# Patient Record
Sex: Female | Born: 1965 | Race: Black or African American | Hispanic: No | Marital: Married | Smoking: Current some day smoker
Health system: Southern US, Community
[De-identification: ages and names within clinical notes are randomized; demographics above are authoritative.]

## PROBLEM LIST (undated history)

## (undated) DIAGNOSIS — I10 Essential (primary) hypertension: Secondary | ICD-10-CM

## (undated) HISTORY — PX: TUBAL LIGATION: SHX77

## (undated) HISTORY — DX: Essential (primary) hypertension: I10

## (undated) HISTORY — PX: TONSILLECTOMY AND ADENOIDECTOMY: SUR1326

---

## 1998-10-13 ENCOUNTER — Other Ambulatory Visit: Admission: RE | Admit: 1998-10-13 | Discharge: 1998-10-13 | Payer: Self-pay | Admitting: Gynecology

## 2000-04-15 ENCOUNTER — Ambulatory Visit (HOSPITAL_COMMUNITY): Admission: RE | Admit: 2000-04-15 | Discharge: 2000-04-15 | Payer: Self-pay | Admitting: Obstetrics and Gynecology

## 2000-04-15 ENCOUNTER — Encounter: Payer: Self-pay | Admitting: Obstetrics and Gynecology

## 2002-03-15 ENCOUNTER — Other Ambulatory Visit: Admission: RE | Admit: 2002-03-15 | Discharge: 2002-03-15 | Payer: Self-pay | Admitting: Gynecology

## 2003-10-11 ENCOUNTER — Other Ambulatory Visit: Admission: RE | Admit: 2003-10-11 | Discharge: 2003-10-11 | Payer: Self-pay | Admitting: Gynecology

## 2004-04-27 ENCOUNTER — Other Ambulatory Visit: Admission: RE | Admit: 2004-04-27 | Discharge: 2004-04-27 | Payer: Self-pay | Admitting: Gynecology

## 2007-01-26 ENCOUNTER — Other Ambulatory Visit: Admission: RE | Admit: 2007-01-26 | Discharge: 2007-01-26 | Payer: Self-pay | Admitting: Gynecology

## 2009-02-06 ENCOUNTER — Other Ambulatory Visit: Admission: RE | Admit: 2009-02-06 | Discharge: 2009-02-06 | Payer: Self-pay | Admitting: Gynecology

## 2009-02-06 ENCOUNTER — Encounter: Payer: Self-pay | Admitting: Women's Health

## 2009-02-06 ENCOUNTER — Ambulatory Visit: Payer: Self-pay | Admitting: Women's Health

## 2010-02-09 ENCOUNTER — Other Ambulatory Visit: Admission: RE | Admit: 2010-02-09 | Discharge: 2010-02-09 | Payer: Self-pay | Admitting: Gynecology

## 2010-02-09 ENCOUNTER — Ambulatory Visit: Payer: Self-pay | Admitting: Women's Health

## 2012-11-09 ENCOUNTER — Other Ambulatory Visit (HOSPITAL_COMMUNITY)
Admission: RE | Admit: 2012-11-09 | Discharge: 2012-11-09 | Disposition: A | Discharge status: Home / Self Care | Payer: 59 | Source: Ambulatory Visit | Attending: Women's Health | Admitting: Women's Health

## 2012-11-09 ENCOUNTER — Encounter: Payer: Self-pay | Admitting: Women's Health

## 2012-11-09 ENCOUNTER — Ambulatory Visit (INDEPENDENT_AMBULATORY_CARE_PROVIDER_SITE_OTHER): Payer: 59 | Admitting: Women's Health

## 2012-11-09 VITALS — BP 118/70 | Ht 65.0 in | Wt 163.0 lb

## 2012-11-09 DIAGNOSIS — B9689 Other specified bacterial agents as the cause of diseases classified elsewhere: Secondary | ICD-10-CM

## 2012-11-09 DIAGNOSIS — L509 Urticaria, unspecified: Secondary | ICD-10-CM

## 2012-11-09 DIAGNOSIS — Z113 Encounter for screening for infections with a predominantly sexual mode of transmission: Secondary | ICD-10-CM | POA: Insufficient documentation

## 2012-11-09 DIAGNOSIS — Z01419 Encounter for gynecological examination (general) (routine) without abnormal findings: Secondary | ICD-10-CM | POA: Insufficient documentation

## 2012-11-09 DIAGNOSIS — N760 Acute vaginitis: Secondary | ICD-10-CM

## 2012-11-09 DIAGNOSIS — A499 Bacterial infection, unspecified: Secondary | ICD-10-CM

## 2012-11-09 LAB — WET PREP FOR TRICH, YEAST, CLUE: Yeast Wet Prep HPF POC: NONE SEEN

## 2012-11-09 MED ORDER — PREDNISONE (PAK) 5 MG PO TABS: ORAL_TABLET | ORAL | Status: DC

## 2012-11-09 MED ORDER — METRONIDAZOLE 500 MG PO TABS: ORAL_TABLET | ORAL | Status: DC

## 2012-11-09 MED ORDER — METRONIDAZOLE 0.75 % VA GEL: VAGINAL | Status: DC

## 2012-11-09 NOTE — Progress Notes (Addendum)
Meredith Cox Dec 03, 1965 [id number]    History:    The patient presents for annual exam.  Regular monthly cycle/BTL/same partner many years. Complaint of vaginal odor especially after menstrual cycle. Normal mammogram yearly with mobile unit at work. Normal Pap history. 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 Has used over-the-counter hydrocortisone lotion without relief.  Past medical history, past surgical history, family history and social history were all reviewed and documented in the EPIC chart. Works at [employer]. 2 daughters 53 in school,28  has 3 children. Maternal aunt breast cancer at 26. Mother hypothyroid.   ROS:  A  ROS was performed and pertinent positives and negatives are included in the history.  Exam:  Filed Vitals:   11/09/12 1448  BP: 118/70    General appearance:  Normal Head/Neck:  Normal, without cervical or supraclavicular adenopathy. Thyroid:  Symmetrical, normal in size, without palpable masses or nodularity. Respiratory  Effort:  Normal  Auscultation:  Clear without wheezing or rhonchi Cardiovascular  Auscultation:  Regular rate, without rubs, murmurs or gallops  Edema/varicosities:  Not grossly evident Abdominal  Soft,nontender, without masses, guarding or rebound.  Liver/spleen:  No organomegaly noted  Hernia:  None appreciated  Skin  Inspection:  Widespread slightly raised hives on upper body  Palpation:  Grossly normal Neurologic/psychiatric  Orientation:  Normal with appropriate conversation.  Mood/affect:  Normal  Genitourinary    Breasts: Examined lying and sitting.     Right: Without masses, retractions, discharge or axillary adenopathy.     Left: Without masses, retractions, discharge or axillary adenopathy.   Inguinal/mons:  Normal without inguinal adenopathy  External genitalia:  Normal  BUS/Urethra/Skene's glands:  Normal  Bladder:   Normal  Vagina:  Wet prep positive for amines, clues, Trichomonas, TNTC bacteria  Cervix:  Normal  Uterus:  retroverted, normal in size, shape and contour.  Midline and mobile  Adnexa/parametria:     Rt: Without masses or tenderness.   Lt: Without masses or tenderness.  Anus and perineum: Normal  Digital rectal exam: Normal sphincter tone without palpated masses or tenderness  Assessment/Plan:  47 y.o. SBF G2P2 for annual exam.    Trichomonas STD screen New onset hives Smoker less than half pack per day  Plan: prednisone 5 mg dose pack given with instructions. Reviewed if no relief to schedule appointment with dermatologist. Encouraged to wear long sleeve shirt under uniform. SBE's, continue annual mammogram, increase regular exercise and decrease calories for weight loss. Instructed to fax health screening labs to our office for review. Flagyl 2 g by mouth for both. Reviewed importance of abstinence, discussing probable infidelity with partner. Pap with GC/Chlamydia, HIV, hep B, C., RPR. Has joined quit smoking for life at work.    Harrington Challenger WHNP, 3:24 PM 11/09/2012

## 2012-11-09 NOTE — Patient Instructions (Signed)

## 2012-11-09 NOTE — Addendum Note (Signed)
Addended by: Richardson Chiquito on: 11/09/2012 04:29 PM   Modules accepted: Orders

## 2012-11-10 LAB — HEPATITIS C ANTIBODY: HCV Ab: NEGATIVE

## 2012-11-10 LAB — HEPATITIS B SURFACE ANTIGEN: Hepatitis B Surface Ag: NEGATIVE

## 2012-11-10 LAB — HIV ANTIBODY (ROUTINE TESTING W REFLEX): HIV: NONREACTIVE

## 2012-11-10 LAB — RPR

## 2012-11-14 ENCOUNTER — Ambulatory Visit: Payer: Self-pay | Admitting: Gynecology

## 2012-11-16 ENCOUNTER — Encounter: Payer: Self-pay | Admitting: Women's Health

## 2013-02-01 ENCOUNTER — Encounter: Payer: Self-pay | Admitting: Women's Health

## 2013-11-13 ENCOUNTER — Encounter: Payer: Self-pay | Admitting: Women's Health

## 2013-11-13 ENCOUNTER — Ambulatory Visit (INDEPENDENT_AMBULATORY_CARE_PROVIDER_SITE_OTHER): Payer: 59 | Admitting: Women's Health

## 2013-11-13 VITALS — BP 130/80 | Ht 65.0 in | Wt 171.0 lb

## 2013-11-13 DIAGNOSIS — Z01419 Encounter for gynecological examination (general) (routine) without abnormal findings: Secondary | ICD-10-CM

## 2013-11-13 DIAGNOSIS — F17200 Nicotine dependence, unspecified, uncomplicated: Secondary | ICD-10-CM | POA: Insufficient documentation

## 2013-11-13 NOTE — Patient Instructions (Signed)

## 2013-11-13 NOTE — Progress Notes (Signed)
Meredith HashimotoBonita M McCullough [birth year] [id number]    History:    Presents for annual exam.  Regular monthly cycle/BTL/same partner. Abnormal Pap in her 20s treated with cryo-, normal Paps after. Normal mammogram history. Smoker. Labs and mammograms at work  Past medical history, past surgical history, family history and social history were all reviewed and documented in the EPIC chart. Works for [employer]. 2 daughters 5021 [employer] in [employer], 48 year old daughter has 3 children. Mother hyperthyroid. Maternal and breast cancer.  ROS:  A  12 point ROS was performed and pertinent positives and negatives are included.  Exam:  Filed Vitals:   11/13/13 1424  BP: 130/80    General appearance:  Normal Thyroid:  Symmetrical, normal in size, without palpable masses or nodularity. Respiratory  Auscultation:  Clear without wheezing or rhonchi Cardiovascular  Auscultation:  Regular rate, without rubs, murmurs or gallops  Edema/varicosities:  Not grossly evident Abdominal  Soft,nontender, without masses, guarding or rebound.  Liver/spleen:  No organomegaly noted  Hernia:  None appreciated  Skin  Inspection:  Grossly normal   Breasts: Examined lying and sitting.     Right: Without masses, retractions, discharge or axillary adenopathy.     Left: Without masses, retractions, discharge or axillary adenopathy. Gentitourinary   Inguinal/mons:  Normal without inguinal adenopathy  External genitalia:  Normal  BUS/Urethra/Skene's glands:  Normal  Vagina:  Normal  Cervix:  Normal  Uterus:   normal in size, shape and contour.  Midline and mobile  Adnexa/parametria:     Rt: Without masses or tenderness.   Lt: Without masses or tenderness.  Anus and perineum: Normal  Digital rectal exam: Normal sphincter tone without palpated masses or tenderness  Assessment/Plan:  48 y.o.SBF G2P2  for annual exam with no complaints.  Normal GYN exam/BTL Smoker  Plan: SBE's, annual mammogram, 3D tomography  reviewed and encouraged history of dense breast, exercise, calcium rich diet, vitamin D 1000 daily encouraged. Aware of need to quit smoking, in smoking cessation program at work. Will fax lab results. Pap normal 2014, new screening guidelines reviewed.   Note: This dictation was prepared with Dragon/digital dictation.  Any transcriptional errors that result are unintentional. Harrington ChallengerYOUNG,Vance Hochmuth J [facility] HospitalWHNP, 3:29 PM 11/13/2013

## 2014-02-07 ENCOUNTER — Encounter: Payer: Self-pay | Admitting: Women's Health

## 2014-02-11 ENCOUNTER — Encounter: Payer: Self-pay | Admitting: Women's Health

## 2014-11-15 ENCOUNTER — Other Ambulatory Visit (HOSPITAL_COMMUNITY)
Admission: RE | Admit: 2014-11-15 | Discharge: 2014-11-15 | Disposition: A | Discharge status: Home / Self Care | Payer: 59 | Source: Ambulatory Visit | Attending: Women's Health | Admitting: Women's Health

## 2014-11-15 ENCOUNTER — Ambulatory Visit (INDEPENDENT_AMBULATORY_CARE_PROVIDER_SITE_OTHER): Payer: 59 | Admitting: Women's Health

## 2014-11-15 ENCOUNTER — Encounter: Payer: Self-pay | Admitting: Women's Health

## 2014-11-15 VITALS — BP 132/90 | Ht 65.0 in | Wt 181.0 lb

## 2014-11-15 DIAGNOSIS — Z720 Tobacco use: Secondary | ICD-10-CM | POA: Diagnosis not present

## 2014-11-15 DIAGNOSIS — B373 Candidiasis of vulva and vagina: Secondary | ICD-10-CM | POA: Diagnosis not present

## 2014-11-15 DIAGNOSIS — Z01419 Encounter for gynecological examination (general) (routine) without abnormal findings: Secondary | ICD-10-CM

## 2014-11-15 DIAGNOSIS — Z113 Encounter for screening for infections with a predominantly sexual mode of transmission: Secondary | ICD-10-CM

## 2014-11-15 DIAGNOSIS — B3731 Acute candidiasis of vulva and vagina: Secondary | ICD-10-CM

## 2014-11-15 DIAGNOSIS — Z1151 Encounter for screening for human papillomavirus (HPV): Secondary | ICD-10-CM | POA: Diagnosis present

## 2014-11-15 DIAGNOSIS — F17200 Nicotine dependence, unspecified, uncomplicated: Secondary | ICD-10-CM

## 2014-11-15 LAB — WET PREP FOR TRICH, YEAST, CLUE
CLUE CELLS WET PREP: NONE SEEN
TRICH WET PREP: NONE SEEN

## 2014-11-15 MED ORDER — VARENICLINE TARTRATE 1 MG PO TABS: 1.0000 mg | ORAL_TABLET | Freq: Two times a day (BID) | ORAL | Status: DC

## 2014-11-15 MED ORDER — FLUCONAZOLE 150 MG PO TABS: 150.0000 mg | ORAL_TABLET | Freq: Once | ORAL | Status: DC

## 2014-11-15 MED ORDER — VARENICLINE TARTRATE 0.5 MG PO TABS: 0.5000 mg | ORAL_TABLET | Freq: Two times a day (BID) | ORAL | Status: DC

## 2014-11-15 NOTE — Patient Instructions (Signed)
Health Maintenance Adopting a healthy lifestyle and getting preventive care can go a long way to promote health and wellness. Talk with your health care provider about what schedule of regular examinations is right for you. This is a good chance for you to check in with your provider about disease prevention and staying healthy. In between checkups, there are plenty of things you can do on your own. Experts have done a lot of research about which lifestyle changes and preventive measures are most likely to keep you healthy. Ask your health care provider for more information. WEIGHT AND DIET  Eat a healthy diet  Be sure to include plenty of vegetables, fruits, low-fat dairy products, and lean protein.  Do not eat a lot of foods high in solid fats, added sugars, or salt.  Get regular exercise. This is one of the most important things you can do for your health.  Most adults should exercise for at least 150 minutes each week. The exercise should increase your heart rate and make you sweat (moderate-intensity exercise).  Most adults should also do strengthening exercises at least twice a week. This is in addition to the moderate-intensity exercise.  Maintain a healthy weight  Body mass index (BMI) is a measurement that can be used to identify possible weight problems. It estimates body fat based on height and weight. Your health care provider can help determine your BMI and help you achieve or maintain a healthy weight.  For females 25 years of age and older:   A BMI below 18.5 is considered underweight.  A BMI of 18.5 to 24.9 is normal.  A BMI of 25 to 29.9 is considered overweight.  A BMI of 30 and above is considered obese.  Watch levels of cholesterol and blood lipids  You should start having your blood tested for lipids and cholesterol at 49 years of age, then have this test every 5 years.  You may need to have your cholesterol levels checked more often if:  Your lipid or  cholesterol levels are high.  You are older than 49 years of age.  You are at high risk for heart disease.  CANCER SCREENING   Lung Cancer  Lung cancer screening is recommended for adults 97-92 years old who are at high risk for lung cancer because of a history of smoking.  A yearly low-dose CT scan of the lungs is recommended for people who:  Currently smoke.  Have quit within the past 15 years.  Have at least a 30-pack-year history of smoking. A pack year is smoking an average of one pack of cigarettes a day for 1 year.  Yearly screening should continue until it has been 15 years since you quit.  Yearly screening should stop if you develop a health problem that would prevent you from having lung cancer treatment.  Breast Cancer  Practice breast self-awareness. This means understanding how your breasts normally appear and feel.  It also means doing regular breast self-exams. Let your health care provider know about any changes, no matter how small.  If you are in your 20s or 30s, you should have a clinical breast exam (CBE) by a health care provider every 1-3 years as part of a regular health exam.  If you are 76 or older, have a CBE every year. Also consider having a breast X-ray (mammogram) every year.  If you have a family history of breast cancer, talk to your health care provider about genetic screening.  If you are  at high risk for breast cancer, talk to your health care provider about having an MRI and a mammogram every year.  Breast cancer gene (BRCA) assessment is recommended for women who have family members with BRCA-related cancers. BRCA-related cancers include:  Breast.  Ovarian.  Tubal.  Peritoneal cancers.  Results of the assessment will determine the need for genetic counseling and BRCA1 and BRCA2 testing. Cervical Cancer Routine pelvic examinations to screen for cervical cancer are no longer recommended for nonpregnant women who are considered low  risk for cancer of the pelvic organs (ovaries, uterus, and vagina) and who do not have symptoms. A pelvic examination may be necessary if you have symptoms including those associated with pelvic infections. Ask your health care provider if a screening pelvic exam is right for you.   The Pap test is the screening test for cervical cancer for women who are considered at risk.  If you had a hysterectomy for a problem that was not cancer or a condition that could lead to cancer, then you no longer need Pap tests.  If you are older than 65 years, and you have had normal Pap tests for the past 10 years, you no longer need to have Pap tests.  If you have had past treatment for cervical cancer or a condition that could lead to cancer, you need Pap tests and screening for cancer for at least 20 years after your treatment.  If you no longer get a Pap test, assess your risk factors if they change (such as having a new sexual partner). This can affect whether you should start being screened again.  Some women have medical problems that increase their chance of getting cervical cancer. If this is the case for you, your health care provider may recommend more frequent screening and Pap tests.  The human papillomavirus (HPV) test is another test that may be used for cervical cancer screening. The HPV test looks for the virus that can cause cell changes in the cervix. The cells collected during the Pap test can be tested for HPV.  The HPV test can be used to screen women 30 years of age and older. Getting tested for HPV can extend the interval between normal Pap tests from three to five years.  An HPV test also should be used to screen women of any age who have unclear Pap test results.  After 49 years of age, women should have HPV testing as often as Pap tests.  Colorectal Cancer  This type of cancer can be detected and often prevented.  Routine colorectal cancer screening usually begins at 50 years of  age and continues through 49 years of age.  Your health care provider may recommend screening at an earlier age if you have risk factors for colon cancer.  Your health care provider may also recommend using home test kits to check for hidden blood in the stool.  A small camera at the end of a tube can be used to examine your colon directly (sigmoidoscopy or colonoscopy). This is done to check for the earliest forms of colorectal cancer.  Routine screening usually begins at age 50.  Direct examination of the colon should be repeated every 5-10 years through 49 years of age. However, you may need to be screened more often if early forms of precancerous polyps or small growths are found. Skin Cancer  Check your skin from head to toe regularly.  Tell your health care provider about any new moles or changes in   moles, especially if there is a change in a mole's shape or color.  Also tell your health care provider if you have a mole that is larger than the size of a pencil eraser.  Always use sunscreen. Apply sunscreen liberally and repeatedly throughout the day.  Protect yourself by wearing long sleeves, pants, a wide-brimmed hat, and sunglasses whenever you are outside. HEART DISEASE, DIABETES, AND HIGH BLOOD PRESSURE   Have your blood pressure checked at least every 1-2 years. High blood pressure causes heart disease and increases the risk of stroke.  If you are between 75 years and 42 years old, ask your health care provider if you should take aspirin to prevent strokes.  Have regular diabetes screenings. This involves taking a blood sample to check your fasting blood sugar level.  If you are at a normal weight and have a low risk for diabetes, have this test once every three years after 49 years of age.  If you are overweight and have a high risk for diabetes, consider being tested at a younger age or more often. PREVENTING INFECTION  Hepatitis B  If you have a higher risk for  hepatitis B, you should be screened for this virus. You are considered at high risk for hepatitis B if:  You were born in a country where hepatitis B is common. Ask your health care provider which countries are considered high risk.  Your parents were born in a high-risk country, and you have not been immunized against hepatitis B (hepatitis B vaccine).  You have HIV or AIDS.  You use needles to inject street drugs.  You live with someone who has hepatitis B.  You have had sex with someone who has hepatitis B.  You get hemodialysis treatment.  You take certain medicines for conditions, including cancer, organ transplantation, and autoimmune conditions. Hepatitis C  Blood testing is recommended for:  Everyone born from [birth year] through 1965.  Anyone with known risk factors for hepatitis C. Sexually transmitted infections (STIs)  You should be screened for sexually transmitted infections (STIs) including gonorrhea and chlamydia if:  You are sexually active and are younger than 48 years of age.  You are older than 49 years of age and your health care provider tells you that you are at risk for this type of infection.  Your sexual activity has changed since you were last screened and you are at an increased risk for chlamydia or gonorrhea. Ask your health care provider if you are at risk.  If you do not have HIV, but are at risk, it may be recommended that you take a prescription medicine daily to prevent HIV infection. This is called pre-exposure prophylaxis (PrEP). You are considered at risk if:  You are sexually active and do not regularly use condoms or know the HIV status of your partner(s).  You take drugs by injection.  You are sexually active with a partner who has HIV. Talk with your health care provider about whether you are at high risk of being infected with HIV. If you choose to begin PrEP, you should first be tested for HIV. You should then be tested every 3 months for  as long as you are taking PrEP.  PREGNANCY   If you are premenopausal and you may become pregnant, ask your health care provider about preconception counseling.  If you may become pregnant, take 400 to 800 micrograms (mcg) of folic acid every day.  If you want to prevent pregnancy, talk to your  health care provider about birth control (contraception). OSTEOPOROSIS AND MENOPAUSE   Osteoporosis is a disease in which the bones lose minerals and strength with aging. This can result in serious bone fractures. Your risk for osteoporosis can be identified using a bone density scan.  If you are 65 years of age or older, or if you are at risk for osteoporosis and fractures, ask your health care provider if you should be screened.  Ask your health care provider whether you should take a calcium or vitamin D supplement to lower your risk for osteoporosis.  Menopause may have certain physical symptoms and risks.  Hormone replacement therapy may reduce some of these symptoms and risks. Talk to your health care provider about whether hormone replacement therapy is right for you.  HOME CARE INSTRUCTIONS   Schedule regular health, dental, and eye exams.  Stay current with your immunizations.   Do not use any tobacco products including cigarettes, chewing tobacco, or electronic cigarettes.  If you are pregnant, do not drink alcohol.  If you are breastfeeding, limit how much and how often you drink alcohol.  Limit alcohol intake to no more than 1 drink per day for nonpregnant women. One drink equals 12 ounces of beer, 5 ounces of wine, or 1 ounces of hard liquor.  Do not use street drugs.  Do not share needles.  Ask your health care provider for help if you need support or information about quitting drugs.  Tell your health care provider if you often feel depressed.  Tell your health care provider if you have ever been abused or do not feel safe at home. Document Released: 10/12/2010  Document Revised: 08/13/2013 Document Reviewed: 02/28/2013 ExitCare Patient Information 2015 [employer]. This information is not intended to replace advice given to you by your health care provider. Make sure you discuss any questions you have with your health care provider. Exercise to Stay Healthy Exercise helps you become and stay healthy. EXERCISE IDEAS AND TIPS Choose exercises that:  You enjoy.  Fit into your day. You do not need to exercise really hard to be healthy. You can do exercises at a slow or medium level and stay healthy. You can:  Stretch before and after working out.  Try yoga, Pilates, or tai chi.  Lift weights.  Walk fast, swim, jog, run, climb stairs, bicycle, dance, or rollerskate.  Take aerobic classes. Exercises that burn about 150 calories:  Running 1  miles in 15 minutes.  Playing volleyball for 45 to 60 minutes.  Washing and waxing a car for 45 to 60 minutes.  Playing touch football for 45 minutes.  Walking 1  miles in 35 minutes.  Pushing a stroller 1  miles in 30 minutes.  Playing basketball for 30 minutes.  Raking leaves for 30 minutes.  Bicycling 5 miles in 30 minutes.  Walking 2 miles in 30 minutes.  Dancing for 30 minutes.  Shoveling snow for 15 minutes.  Swimming laps for 20 minutes.  Walking up stairs for 15 minutes.  Bicycling 4 miles in 15 minutes.  Gardening for 30 to 45 minutes.  Jumping rope for 15 minutes.  Washing windows or floors for 45 to 60 minutes. Document Released: 05/01/2010 Document Revised: 06/21/2011 Document Reviewed: 05/01/2010 ExitCare Patient Information 2015 [employer]. This information is not intended to replace advice given to you by your health care provider. Make sure you discuss any questions you have with your health care provider.  

## 2014-11-15 NOTE — Progress Notes (Signed)
Meredith Cox 24-Apr-1965 [id number]    History:    Presents for annual exam.  Cycles becoming irregular, bled most of the month of June, no bleeding in July or yet this month. Monthly cycle prior to June. BTL. 2005 LGSIL cryo-with normal Paps after. Normal mammogram history. Normal screening labs at work. Same partner.  Past medical history, past surgical history, family history and social history were all reviewed and documented in the EPIC chart. Works at [employer]. 2 daughters both doing well.  ROS:  A ROS was performed and pertinent positives and negatives are included.  Exam:  Filed Vitals:   11/15/14 0816  BP: 132/90    General appearance:  Normal Thyroid:  Symmetrical, normal in size, without palpable masses or nodularity. Respiratory  Auscultation:  Clear without wheezing or rhonchi Cardiovascular  Auscultation:  Regular rate, without rubs, murmurs or gallops  Edema/varicosities:  Not grossly evident Abdominal  Soft,nontender, without masses, guarding or rebound.  Liver/spleen:  No organomegaly noted  Hernia:  None appreciated  Skin  Inspection:  Grossly normal   Breasts: Examined lying and sitting.     Right: Without masses, retractions, discharge or axillary adenopathy.     Left: Without masses, retractions, discharge or axillary adenopathy. Gentitourinary   Inguinal/mons:  Normal without inguinal adenopathy  External genitalia:  Normal  BUS/Urethra/Skene's glands:  Normal  Vagina:  Normal wet prep positive for yeast  Cervix:  Normal  Uterus:   normal in size, shape and contour.  Midline and mobile  Adnexa/parametria:     Rt: Without masses or tenderness.   Lt: Without masses or tenderness.  Anus and perineum: Normal  Digital rectal exam: Normal sphincter tone without palpated masses or tenderness  Assessment/Plan:  49 y.o. SBF G2 P2 for annual exam.   Irregular cycle -bled most of June, none since Yeast vaginitis Menopausal  symptoms BTL 2005 CIN-1 cryo-normal Paps after Smoker  Plan: Diflucan 150 by mouth times one dose with refill. Keep menstrual record if continued issues with irregular bleeding, cycle lasting greater than 7 days or more frequent than every 21 instructed to call will proceed with sonohysterogram. Menopausal symptoms reviewed. SBE's, continue annual mammogram, 3-D tomography reviewed and encouraged history of dense breasts. GC/Chlamydia,, Pap with HR HPV typing, new screening guidelines reviewed. Smoking cessation reviewed, currently in the smoking cessation program at work helping some, Chantix reviewed, reviewed risks of bad dreams, would like to try prescription, proper use given and reviewed start up instructions. Aware of need to quit smoking.      Harrington Challenger [facility], 5:15 PM 11/15/2014

## 2014-11-16 LAB — GC/CHLAMYDIA PROBE AMP
CT Probe RNA: NEGATIVE
GC Probe RNA: NEGATIVE

## 2014-11-18 ENCOUNTER — Telehealth: Payer: Self-pay | Admitting: *Deleted

## 2014-11-18 LAB — CYTOLOGY - PAP

## 2014-11-18 NOTE — Telephone Encounter (Signed)
PA done and Rx for Chantix has been approved by insurance effective 11/16/14-11/16/15

## 2015-01-01 ENCOUNTER — Telehealth: Payer: Self-pay | Admitting: *Deleted

## 2015-01-01 NOTE — Telephone Encounter (Signed)
Prior Authorization for Chantix 1 mg done online via cover my meds, will wait for response.

## 2015-02-05 NOTE — Telephone Encounter (Signed)
[facility], will wait for response.

## 2015-02-06 NOTE — Telephone Encounter (Signed)
Pharmacy aware and pt as well

## 2015-02-06 NOTE — Telephone Encounter (Signed)
Medication approved effective 02/06/15-02/06/16 per [facility]

## 2015-02-14 ENCOUNTER — Encounter: Payer: Self-pay | Admitting: Women's Health

## 2015-11-10 ENCOUNTER — Telehealth: Payer: Self-pay | Admitting: *Deleted

## 2015-11-10 NOTE — Telephone Encounter (Signed)
Pt called c/o new finding breast lump, pt wanted to have order for 3-d mammogram. I explained to pt that she will need to see provider if breast problem. Pt said she will call to schedule OV

## 2015-11-18 ENCOUNTER — Encounter: Payer: 59 | Admitting: Women's Health

## 2015-12-04 ENCOUNTER — Ambulatory Visit (INDEPENDENT_AMBULATORY_CARE_PROVIDER_SITE_OTHER): Payer: 59 | Admitting: Women's Health

## 2015-12-04 ENCOUNTER — Encounter: Payer: Self-pay | Admitting: Women's Health

## 2015-12-04 VITALS — BP 122/74 | Ht 65.0 in | Wt 178.0 lb

## 2015-12-04 DIAGNOSIS — Z720 Tobacco use: Secondary | ICD-10-CM | POA: Diagnosis not present

## 2015-12-04 DIAGNOSIS — F17200 Nicotine dependence, unspecified, uncomplicated: Secondary | ICD-10-CM

## 2015-12-04 DIAGNOSIS — Z01419 Encounter for gynecological examination (general) (routine) without abnormal findings: Secondary | ICD-10-CM | POA: Diagnosis not present

## 2015-12-04 MED ORDER — VARENICLINE TARTRATE 0.5 MG PO TABS: 0.5000 mg | ORAL_TABLET | Freq: Two times a day (BID) | ORAL | 1 refills | Status: DC

## 2015-12-04 NOTE — Progress Notes (Signed)
Helen HashimotoBonita M McCullough 07-Jul-1965 [id number]    History:    Presents for annual exam.  Monthly cycle/BTL/same partner with negative STD screen. 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 Normal mammogram history. Reports normal labs at work is having them done next month. Has not had a screening colonoscopy. Half a pack smoking daily. Regular exercise, walking greater than 10,000 steps daily.  Past medical history, past surgical history, family history and social history were all reviewed and documented in the EPIC chart. Works for [employer]. 2 daughters both doing well.  ROS:  A ROS was performed and pertinent positives and negatives are included.  Exam:  Vitals:   12/04/15 0907  BP: 122/74  Weight: 178 lb (80.7 kg)  Height: 5\' 5"  (1.651 m)   Body mass index is 29.62 kg/m.   General appearance:  Normal Thyroid:  Symmetrical, normal in size, without palpable masses or nodularity. Respiratory  Auscultation:  Clear without wheezing or rhonchi Cardiovascular  Auscultation:  Regular rate, without rubs, murmurs or gallops  Edema/varicosities:  Not grossly evident Abdominal  Soft,nontender, without masses, guarding or rebound.  Liver/spleen:  No organomegaly noted  Hernia:  None appreciated  Skin  Inspection:  Grossly normal   Breasts: Examined lying and sitting.     Right: Without masses, retractions, discharge or axillary adenopathy.     Left: Without masses, retractions, discharge or axillary adenopathy. Gentitourinary   Inguinal/mons:  Normal without inguinal adenopathy  External genitalia:  Normal  BUS/Urethra/Skene's glands:  Normal  Vagina:  Normal  Cervix:  Normal  Uterus:  normal in size, shape and contour.  Midline and mobile  Adnexa/parametria:     Rt: Without masses or tenderness.   Lt: Without masses or tenderness.  Anus and perineum: Normal  Digital rectal exam: Normal sphincter tone without palpated masses or tenderness  Assessment/Plan:  50 y.o. SBF  G2P2 for annual exam.     Monthly cycle/BTL/same partner 2005 LGSIL cryo-normal Paps after Smoker less than half pack daily  Plan: Aware of need to quit smoking will start back on Chantix has a prescription, reports no side effects in the past. Reviewed importance of changing routine. SBE's, annual screening mammogram, calcium rich diet, vitamin D 1000 daily encouraged. Continue healthy lifestyle of regular exercise, walking. Will schedule screening colonoscopy and even. Instructed to have labs that were drawn at work faxed to our office. UA, Pap normal with negative HR HPV 2016, new screening guidelines reviewed.    Harrington ChallengerYOUNG,Luree Palla J [facility] HospitalWHNP, 10:43 AM 12/04/2015

## 2015-12-04 NOTE — Patient Instructions (Addendum)
Health Maintenance, Female Adopting a healthy lifestyle and getting preventive care can go a long way to promote health and wellness. Talk with your health care provider about what schedule of regular examinations is right for you. This is a good chance for you to check in with your provider about disease prevention and staying healthy. In between checkups, there are plenty of things you can do on your own. Experts have done a lot of research about which lifestyle changes and preventive measures are most likely to keep you healthy. Ask your health care provider for more information. WEIGHT AND DIET  Eat a healthy diet  Be sure to include plenty of vegetables, fruits, low-fat dairy products, and lean protein.  Do not eat a lot of foods high in solid fats, added sugars, or salt.  Get regular exercise. This is one of the most important things you can do for your health.  Most adults should exercise for at least 150 minutes each week. The exercise should increase your heart rate and make you sweat (moderate-intensity exercise).  Most adults should also do strengthening exercises at least twice a week. This is in addition to the moderate-intensity exercise.  Maintain a healthy weight  Body mass index (BMI) is a measurement that can be used to identify possible weight problems. It estimates body fat based on height and weight. Your health care provider can help determine your BMI and help you achieve or maintain a healthy weight.  For females 20 years of age and older:   A BMI below 18.5 is considered underweight.  A BMI of 18.5 to 24.9 is normal.  A BMI of 25 to 29.9 is considered overweight.  A BMI of 30 and above is considered obese.  Watch levels of cholesterol and blood lipids  You should start having your blood tested for lipids and cholesterol at 50 years of age, then have this test every 5 years.  You may need to have your cholesterol levels checked more often if:  Your lipid  or cholesterol levels are high.  You are older than 50 years of age.  You are at high risk for heart disease.  CANCER SCREENING   Lung Cancer  Lung cancer screening is recommended for adults 55-80 years old who are at high risk for lung cancer because of a history of smoking.  A yearly low-dose CT scan of the lungs is recommended for people who:  Currently smoke.  Have quit within the past 15 years.  Have at least a 30-pack-year history of smoking. A pack year is smoking an average of one pack of cigarettes a day for 1 year.  Yearly screening should continue until it has been 15 years since you quit.  Yearly screening should stop if you develop a health problem that would prevent you from having lung cancer treatment.  Breast Cancer  Practice breast self-awareness. This means understanding how your breasts normally appear and feel.  It also means doing regular breast self-exams. Let your health care provider know about any changes, no matter how small.  If you are in your 20s or 30s, you should have a clinical breast exam (CBE) by a health care provider every 1-3 years as part of a regular health exam.  If you are 40 or older, have a CBE every year. Also consider having a breast X-ray (mammogram) every year.  If you have a family history of breast cancer, talk to your health care provider about genetic screening.  If you   you are at high risk for breast cancer, talk to your health care provider about having an MRI and a mammogram every year.  Breast cancer gene (BRCA) assessment is recommended for women who have family members with BRCA-related cancers. BRCA-related cancers include:  Breast.  Ovarian.  Tubal.  Peritoneal cancers.  Results of the assessment will determine the need for genetic counseling and BRCA1 and BRCA2 testing. Cervical Cancer Your health care provider may recommend that you be screened regularly for cancer of the pelvic organs (ovaries, uterus,  and vagina). This screening involves a pelvic examination, including checking for microscopic changes to the surface of your cervix (Pap test). You may be encouraged to have this screening done every 3 years, beginning at age 29.  For women ages 44-65, health care providers may recommend pelvic exams and Pap testing every 3 years, or they may recommend the Pap and pelvic exam, combined with testing for human papilloma virus (HPV), every 5 years. Some types of HPV increase your risk of cervical cancer. Testing for HPV may also be done on women of any age with unclear Pap test results.  Other health care providers may not recommend any screening for nonpregnant women who are considered low risk for pelvic cancer and who do not have symptoms. Ask your health care provider if a screening pelvic exam is right for you.  If you have had past treatment for cervical cancer or a condition that could lead to cancer, you need Pap tests and screening for cancer for at least 20 years after your treatment. If Pap tests have been discontinued, your risk factors (such as having a new sexual partner) need to be reassessed to determine if screening should resume. Some women have medical problems that increase the chance of getting cervical cancer. In these cases, your health care provider may recommend more frequent screening and Pap tests. Colorectal Cancer  This type of cancer can be detected and often prevented.  Routine colorectal cancer screening usually begins at 50 years of age and continues through 50 years of age.  Your health care provider may recommend screening at an earlier age if you have risk factors for colon cancer.  Your health care provider may also recommend using home test kits to check for hidden blood in the stool.  A small camera at the end of a tube can be used to examine your colon directly (sigmoidoscopy or colonoscopy). This is done to check for the earliest forms of colorectal  cancer.  Routine screening usually begins at age 29.  Direct examination of the colon should be repeated every 5-10 years through 50 years of age. However, you may need to be screened more often if early forms of precancerous polyps or small growths are found. Skin Cancer  Check your skin from head to toe regularly.  Tell your health care provider about any new moles or changes in moles, especially if there is a change in a mole's shape or color.  Also tell your health care provider if you have a mole that is larger than the size of a pencil eraser.  Always use sunscreen. Apply sunscreen liberally and repeatedly throughout the day.  Protect yourself by wearing long sleeves, pants, a wide-brimmed hat, and sunglasses whenever you are outside. HEART DISEASE, DIABETES, AND HIGH BLOOD PRESSURE   High blood pressure causes heart disease and increases the risk of stroke. High blood pressure is more likely to develop in:  People who have blood pressure in the high  of the normal range (130-139/85-89 mm Hg).  People who are overweight or obese.  People who are African American.  If you are 18-39 years of age, have your blood pressure checked every 3-5 years. If you are 40 years of age or older, have your blood pressure checked every year. You should have your blood pressure measured twice--once when you are at a hospital or clinic, and once when you are not at a hospital or clinic. Record the average of the two measurements. To check your blood pressure when you are not at a hospital or clinic, you can use:  An automated blood pressure machine at a pharmacy.  A home blood pressure monitor.  If you are between 55 years and 79 years old, ask your health care provider if you should take aspirin to prevent strokes.  Have regular diabetes screenings. This involves taking a blood sample to check your fasting blood sugar level.  If you are at a normal weight and have a low risk for diabetes,  have this test once every three years after 50 years of age.  If you are overweight and have a high risk for diabetes, consider being tested at a younger age or more often. PREVENTING INFECTION  Hepatitis B  If you have a higher risk for hepatitis B, you should be screened for this virus. You are considered at high risk for hepatitis B if:  You were born in a country where hepatitis B is common. Ask your health care provider which countries are considered high risk.  Your parents were born in a high-risk country, and you have not been immunized against hepatitis B (hepatitis B vaccine).  You have HIV or AIDS.  You use needles to inject street drugs.  You live with someone who has hepatitis B.  You have had sex with someone who has hepatitis B.  You get hemodialysis treatment.  You take certain medicines for conditions, including cancer, organ transplantation, and autoimmune conditions. Hepatitis C  Blood testing is recommended for:  Everyone born from 1945 through 1965.  Anyone with known risk factors for hepatitis C. Sexually transmitted infections (STIs)  You should be screened for sexually transmitted infections (STIs) including gonorrhea and chlamydia if:  You are sexually active and are younger than 50 years of age.  You are older than 50 years of age and your health care provider tells you that you are at risk for this type of infection.  Your sexual activity has changed since you were last screened and you are at an increased risk for chlamydia or gonorrhea. Ask your health care provider if you are at risk.  If you do not have HIV, but are at risk, it may be recommended that you take a prescription medicine daily to prevent HIV infection. This is called pre-exposure prophylaxis (PrEP). You are considered at risk if:  You are sexually active and do not regularly use condoms or know the HIV status of your partner(s).  You take drugs by injection.  You are sexually  active with a partner who has HIV. Talk with your health care provider about whether you are at high risk of being infected with HIV. If you choose to begin PrEP, you should first be tested for HIV. You should then be tested every 3 months for as long as you are taking PrEP.  PREGNANCY   If you are premenopausal and you may become pregnant, ask your health care provider about preconception counseling.  If you may   become pregnant, take 400 to 800 micrograms (mcg) of folic acid every day.  If you want to prevent pregnancy, talk to your health care provider about birth control (contraception). OSTEOPOROSIS AND MENOPAUSE   Osteoporosis is a disease in which the bones lose minerals and strength with aging. This can result in serious bone fractures. Your risk for osteoporosis can be identified using a bone density scan.  If you are 65 years of age or older, or if you are at risk for osteoporosis and fractures, ask your health care provider if you should be screened.  Ask your health care provider whether you should take a calcium or vitamin D supplement to lower your risk for osteoporosis.  Menopause may have certain physical symptoms and risks.  Hormone replacement therapy may reduce some of these symptoms and risks. Talk to your health care provider about whether hormone replacement therapy is right for you.  HOME CARE INSTRUCTIONS   Schedule regular health, dental, and eye exams.  Stay current with your immunizations.   Do not use any tobacco products including cigarettes, chewing tobacco, or electronic cigarettes.  If you are pregnant, do not drink alcohol.  If you are breastfeeding, limit how much and how often you drink alcohol.  Limit alcohol intake to no more than 1 drink per day for nonpregnant women. One drink equals 12 ounces of beer, 5 ounces of wine, or 1 ounces of hard liquor.  Do not use street drugs.  Do not share needles.  Ask your health care provider for help if  you need support or information about quitting drugs.  Tell your health care provider if you often feel depressed.  Tell your health care provider if you have ever been abused or do not feel safe at home.   This information is not intended to replace advice given to you by your health care provider. Make sure you discuss any questions you have with your health care provider.   Document Released: 10/12/2010 Document Revised: 04/19/2014 Document Reviewed: 02/28/2013 Elsevier Interactive Patient Education 2016 Elsevier Inc.  Smoking Cessation, Tips for Success If you are ready to quit smoking, congratulations! You have chosen to help yourself be healthier. Cigarettes bring nicotine, tar, carbon monoxide, and other irritants into your body. Your lungs, heart, and blood vessels will be able to work better without these poisons. There are many different ways to quit smoking. Nicotine gum, nicotine patches, a nicotine inhaler, or nicotine nasal spray can help with physical craving. Hypnosis, support groups, and medicines help break the habit of smoking. WHAT THINGS CAN I DO TO MAKE QUITTING EASIER?  Here are some tips to help you quit for good:  Pick a date when you will quit smoking completely. Tell all of your friends and family about your plan to quit on that date.  Do not try to slowly cut down on the number of cigarettes you are smoking. Pick a quit date and quit smoking completely starting on that day.  Throw away all cigarettes.   Clean and remove all ashtrays from your home, work, and car.  On a card, write down your reasons for quitting. Carry the card with you and read it when you get the urge to smoke.  Cleanse your body of nicotine. Drink enough water and fluids to keep your urine clear or pale yellow. Do this after quitting to flush the nicotine from your body.  Learn to predict your moods. Do not let a bad situation be your excuse to have   a cigarette. Some situations in your life  might tempt you into wanting a cigarette.  Never have "just one" cigarette. It leads to wanting another and another. Remind yourself of your decision to quit.  Change habits associated with smoking. If you smoked while driving or when feeling stressed, try other activities to replace smoking. Stand up when drinking your coffee. Brush your teeth after eating. Sit in a different chair when you read the paper. Avoid alcohol while trying to quit, and try to drink fewer caffeinated beverages. Alcohol and caffeine may urge you to smoke.  Avoid foods and drinks that can trigger a desire to smoke, such as sugary or spicy foods and alcohol.  Ask people who smoke not to smoke around you.  Have something planned to do right after eating or having a cup of coffee. For example, plan to take a walk or exercise.  Try a relaxation exercise to calm you down and decrease your stress. Remember, you may be tense and nervous for the first 2 weeks after you quit, but this will pass.  Find new activities to keep your hands busy. Play with a pen, coin, or rubber band. Doodle or draw things on paper.  Brush your teeth right after eating. This will help cut down on the craving for the taste of tobacco after meals. You can also try mouthwash.   Use oral substitutes in place of cigarettes. Try using lemon drops, carrots, cinnamon sticks, or chewing gum. Keep them handy so they are available when you have the urge to smoke.  When you have the urge to smoke, try deep breathing.  Designate your home as a nonsmoking area.  If you are a heavy smoker, ask your health care provider about a prescription for nicotine chewing gum. It can ease your withdrawal from nicotine.  Reward yourself. Set aside the cigarette money you save and buy yourself something nice.  Look for support from others. Join a support group or smoking cessation program. Ask someone at home or at work to help you with your plan to quit smoking.  Always  ask yourself, "Do I need this cigarette or is this just a reflex?" Tell yourself, "Today, I choose not to smoke," or "I do not want to smoke." You are reminding yourself of your decision to quit.  Do not replace cigarette smoking with electronic cigarettes (commonly called e-cigarettes). The safety of e-cigarettes is unknown, and some may contain harmful chemicals.  If you relapse, do not give up! Plan ahead and think about what you will do the next time you get the urge to smoke. HOW WILL I FEEL WHEN I QUIT SMOKING? You may have symptoms of withdrawal because your body is used to nicotine (the addictive substance in cigarettes). You may crave cigarettes, be irritable, feel very hungry, cough often, get headaches, or have difficulty concentrating. The withdrawal symptoms are only temporary. They are strongest when you first quit but will go away within 10-14 days. When withdrawal symptoms occur, stay in control. Think about your reasons for quitting. Remind yourself that these are signs that your body is healing and getting used to being without cigarettes. Remember that withdrawal symptoms are easier to treat than the major diseases that smoking can cause.  Even after the withdrawal is over, expect periodic urges to smoke. However, these cravings are generally short lived and will go away whether you smoke or not. Do not smoke! WHAT RESOURCES ARE AVAILABLE TO HELP ME QUIT SMOKING? Your health care   provider can direct you to community resources or hospitals for support, which may include:  Group support.  Education.  Hypnosis.  Therapy.   This information is not intended to replace advice given to you by your health care provider. Make sure you discuss any questions you have with your health care provider.   Document Released: 12/26/2003 Document Revised: 04/19/2014 Document Reviewed: 09/14/2012 Elsevier Interactive Patient Education 2016 Elsevier Inc.  

## 2015-12-05 LAB — URINALYSIS W MICROSCOPIC + REFLEX CULTURE
BILIRUBIN URINE: NEGATIVE
Bacteria, UA: NONE SEEN [HPF]
Casts: NONE SEEN [LPF]
Crystals: NONE SEEN [HPF]
Glucose, UA: NEGATIVE
Hgb urine dipstick: NEGATIVE
KETONES UR: NEGATIVE
NITRITE: NEGATIVE
PH: 7 (ref 5.0–8.0)
Protein, ur: NEGATIVE
SPECIFIC GRAVITY, URINE: 1.01 (ref 1.001–1.035)
Yeast: NONE SEEN [HPF]

## 2015-12-06 LAB — URINE CULTURE

## 2016-01-24 ENCOUNTER — Other Ambulatory Visit: Payer: Self-pay | Admitting: Women's Health

## 2016-01-24 DIAGNOSIS — F17200 Nicotine dependence, unspecified, uncomplicated: Secondary | ICD-10-CM

## 2016-01-26 NOTE — Telephone Encounter (Signed)
Ok for refill? 

## 2016-02-04 ENCOUNTER — Other Ambulatory Visit: Payer: Self-pay | Admitting: Women's Health

## 2016-02-04 DIAGNOSIS — Z1231 Encounter for screening mammogram for malignant neoplasm of breast: Secondary | ICD-10-CM

## 2016-02-25 ENCOUNTER — Other Ambulatory Visit: Payer: Self-pay | Admitting: Women's Health

## 2016-02-25 DIAGNOSIS — F17200 Nicotine dependence, unspecified, uncomplicated: Secondary | ICD-10-CM

## 2016-02-26 ENCOUNTER — Ambulatory Visit
Admission: RE | Admit: 2016-02-26 | Discharge: 2016-02-26 | Disposition: A | Discharge status: Home / Self Care | Payer: 59 | Source: Ambulatory Visit | Attending: Women's Health | Admitting: Women's Health

## 2016-02-26 DIAGNOSIS — Z1231 Encounter for screening mammogram for malignant neoplasm of breast: Secondary | ICD-10-CM

## 2016-02-27 ENCOUNTER — Telehealth: Payer: Self-pay | Admitting: *Deleted

## 2016-02-27 NOTE — Telephone Encounter (Signed)
Chantix 0.5 mg Rx was denied medication is not a preferred drug on formulary.

## 2016-03-18 ENCOUNTER — Other Ambulatory Visit: Payer: Self-pay | Admitting: Women's Health

## 2016-03-18 DIAGNOSIS — F17200 Nicotine dependence, unspecified, uncomplicated: Secondary | ICD-10-CM

## 2016-03-19 ENCOUNTER — Encounter: Payer: Self-pay | Admitting: *Deleted

## 2016-03-19 NOTE — Telephone Encounter (Signed)
Appeals letter faxed to [phone] via [facility]

## 2016-03-22 NOTE — Telephone Encounter (Signed)
Chantix approved 03/22/16-03/22/2017

## 2016-04-24 ENCOUNTER — Other Ambulatory Visit: Payer: Self-pay | Admitting: Women's Health

## 2016-04-24 DIAGNOSIS — F17200 Nicotine dependence, unspecified, uncomplicated: Secondary | ICD-10-CM

## 2016-05-09 ENCOUNTER — Other Ambulatory Visit: Payer: Self-pay | Admitting: Women's Health

## 2016-05-09 DIAGNOSIS — F17200 Nicotine dependence, unspecified, uncomplicated: Secondary | ICD-10-CM

## 2016-05-11 ENCOUNTER — Other Ambulatory Visit: Payer: Self-pay | Admitting: Women's Health

## 2016-05-11 DIAGNOSIS — Z716 Tobacco abuse counseling: Secondary | ICD-10-CM

## 2016-05-11 MED ORDER — VARENICLINE TARTRATE 1 MG PO TABS: 1.0000 mg | ORAL_TABLET | Freq: Two times a day (BID) | ORAL | 3 refills | Status: DC

## 2016-07-02 ENCOUNTER — Other Ambulatory Visit: Payer: Self-pay | Admitting: Women's Health

## 2016-07-02 DIAGNOSIS — F17200 Nicotine dependence, unspecified, uncomplicated: Secondary | ICD-10-CM

## 2016-08-25 ENCOUNTER — Encounter: Payer: Self-pay | Admitting: Gynecology

## 2016-08-31 ENCOUNTER — Other Ambulatory Visit: Payer: Self-pay | Admitting: Women's Health

## 2016-08-31 DIAGNOSIS — F17200 Nicotine dependence, unspecified, uncomplicated: Secondary | ICD-10-CM

## 2016-09-22 ENCOUNTER — Other Ambulatory Visit: Payer: Self-pay | Admitting: Women's Health

## 2016-09-22 DIAGNOSIS — F17200 Nicotine dependence, unspecified, uncomplicated: Secondary | ICD-10-CM

## 2016-12-07 ENCOUNTER — Encounter: Payer: 59 | Admitting: Women's Health

## 2016-12-14 ENCOUNTER — Encounter: Payer: Self-pay | Admitting: Women's Health

## 2016-12-14 ENCOUNTER — Ambulatory Visit (INDEPENDENT_AMBULATORY_CARE_PROVIDER_SITE_OTHER): Payer: 59 | Admitting: Women's Health

## 2016-12-14 VITALS — Ht 65.0 in | Wt 175.0 lb

## 2016-12-14 DIAGNOSIS — Z716 Tobacco abuse counseling: Secondary | ICD-10-CM | POA: Diagnosis not present

## 2016-12-14 DIAGNOSIS — Z720 Tobacco use: Secondary | ICD-10-CM

## 2016-12-14 DIAGNOSIS — Z01419 Encounter for gynecological examination (general) (routine) without abnormal findings: Secondary | ICD-10-CM

## 2016-12-14 MED ORDER — VARENICLINE TARTRATE 1 MG PO TABS: 1.0000 mg | ORAL_TABLET | Freq: Two times a day (BID) | ORAL | 3 refills | Status: DC

## 2016-12-14 NOTE — Progress Notes (Signed)
Meredith HashimotoBonita M Cox Jun 06, 1965 [id number]    History:    Presents for annual exam.  Monthly cycle/BTL. Same partner. 2005 LGSIL with cryo- normal Paps after. Normal mammogram history. Has not had a screening colonoscopy. Had almost quit using Chantix, mother died unexpectedly in April and started back. Would like to get back on Chantix and quit.  Past medical history, past surgical history, family history and social history were all reviewed and documented in the EPIC chart. Works at [employer]. 2 daughters, [place] in [place], no children other daughter lives local with 4 children.  ROS:  A ROS was performed and pertinent positives and negatives are included.  Exam:  Vitals:   12/14/16 1405  Weight: 175 lb (79.4 kg)  Height: 5\' 5"  (1.651 m)   Body mass index is 29.12 kg/m.   General appearance:  Normal Thyroid:  Symmetrical, normal in size, without palpable masses or nodularity. Respiratory  Auscultation:  Clear without wheezing or rhonchi Cardiovascular  Auscultation:  Regular rate, without rubs, murmurs or gallops  Edema/varicosities:  Not grossly evident Abdominal  Soft,nontender, without masses, guarding or rebound.  Liver/spleen:  No organomegaly noted  Hernia:  None appreciated  Skin  Inspection:  Grossly normal   Breasts: Examined lying and sitting.     Right: Without masses, retractions, discharge or axillary adenopathy.     Left: Without masses, retractions, discharge or axillary adenopathy. Gentitourinary   Inguinal/mons:  Normal without inguinal adenopathy  External genitalia:  Normal  BUS/Urethra/Skene's glands:  Normal  Vagina:  Normal  Cervix:  Normal  Uterus:  normal in size, shape and contour.  Midline and mobile  Adnexa/parametria:     Rt: Without masses or tenderness.   Lt: Without masses or tenderness.  Anus and perineum: Normal  Digital rectal exam: Normal sphincter tone without palpated masses or tenderness  Assessment/Plan:  51 y.o. SBF  G4 P2 for annual exam with no complaints.  Monthly cycle/BTL Smoker Overweight 2005 cryo-for LGSIL  Plan: Chantix 1 mg twice daily prescription, proper use given and reviewed. Aware of need to pick start/quit date. SBE's, continue annual screening mammogram due in November. [facility] information given instructed to schedule screening colonoscopy. Minimal menopausal symptoms, menopause reviewed. Aware of need to increase exercise and decrease calories/carbs for weight loss. Vitamin D 2000 daily encouraged. Condolences given regarding recent loss of mother. Pap with HR HPV typing.  Harrington Challengerancy J Young [facility] Jennie EdmundsonWHNP, 2:06 PM 12/14/2016

## 2016-12-14 NOTE — Patient Instructions (Signed)
[facility] Dr Carlean Purl  [place] Maintenance for Postmenopausal Women Menopause is a normal process in which your reproductive ability comes to an end. This process happens gradually over a span of months to years, usually between the ages of 33 and 75. Menopause is complete when you have missed 12 consecutive menstrual periods. It is important to talk with your health care provider about some of the most common conditions that affect postmenopausal women, such as heart disease, cancer, and bone loss (osteoporosis). Adopting a healthy lifestyle and getting preventive care can help to promote your health and wellness. Those actions can also lower your chances of developing some of these common conditions. What should I know about menopause? During menopause, you may experience a number of symptoms, such as:  Moderate-to-severe hot flashes.  Night sweats.  Decrease in sex drive.  Mood swings.  Headaches.  Tiredness.  Irritability.  Memory problems.  Insomnia.  Choosing to treat or not to treat menopausal changes is an individual decision that you make with your health care provider. What should I know about hormone replacement therapy and supplements? Hormone therapy products are effective for treating symptoms that are associated with menopause, such as hot flashes and night sweats. Hormone replacement carries certain risks, especially as you become older. If you are thinking about using estrogen or estrogen with progestin treatments, discuss the benefits and risks with your health care provider. What should I know about heart disease and stroke? Heart disease, heart attack, and stroke become more likely as you age. This may be due, in part, to the hormonal changes that your body experiences during menopause. These can affect how your body processes dietary fats, triglycerides, and cholesterol. Heart attack and stroke are both medical emergencies. There are many things that you can  do to help prevent heart disease and stroke:  Have your blood pressure checked at least every 1-2 years. High blood pressure causes heart disease and increases the risk of stroke.  If you are 43-66 years old, ask your health care provider if you should take aspirin to prevent a heart attack or a stroke.  Do not use any tobacco products, including cigarettes, chewing tobacco, or electronic cigarettes. If you need help quitting, ask your health care provider.  It is important to eat a healthy diet and maintain a healthy weight. ? Be sure to include plenty of vegetables, fruits, low-fat dairy products, and lean protein. ? Avoid eating foods that are high in solid fats, added sugars, or salt (sodium).  Get regular exercise. This is one of the most important things that you can do for your health. ? Try to exercise for at least 150 minutes each week. The type of exercise that you do should increase your heart rate and make you sweat. This is known as moderate-intensity exercise. ? Try to do strengthening exercises at least twice each week. Do these in addition to the moderate-intensity exercise.  Know your numbers.Ask your health care provider to check your cholesterol and your blood glucose. Continue to have your blood tested as directed by your health care provider.  What should I know about cancer screening? There are several types of cancer. Take the following steps to reduce your risk and to catch any cancer development as early as possible. Breast Cancer  Practice breast self-awareness. ? This means understanding how your breasts normally appear and feel. ? It also means doing regular breast self-exams. Let your health care provider know about any changes, no matter how  small.  If you are 40 or older, have a clinician do a breast exam (clinical breast exam or CBE) every year. Depending on your age, family history, and medical history, it may be recommended that you also have a yearly  breast X-ray (mammogram).  If you have a family history of breast cancer, talk with your health care provider about genetic screening.  If you are at high risk for breast cancer, talk with your health care provider about having an MRI and a mammogram every year.  Breast cancer (BRCA) gene test is recommended for women who have family members with BRCA-related cancers. Results of the assessment will determine the need for genetic counseling and BRCA1 and for BRCA2 testing. BRCA-related cancers include these types: ? Breast. This occurs in males or females. ? Ovarian. ? Tubal. This may also be called fallopian tube cancer. ? Cancer of the abdominal or pelvic lining (peritoneal cancer). ? Prostate. ? Pancreatic.  Cervical, Uterine, and Ovarian Cancer Your health care provider may recommend that you be screened regularly for cancer of the pelvic organs. These include your ovaries, uterus, and vagina. This screening involves a pelvic exam, which includes checking for microscopic changes to the surface of your cervix (Pap test).  For women ages 21-65, health care providers may recommend a pelvic exam and a Pap test every three years. For women ages 30-65, they may recommend the Pap test and pelvic exam, combined with testing for human papilloma virus (HPV), every five years. Some types of HPV increase your risk of cervical cancer. Testing for HPV may also be done on women of any age who have unclear Pap test results.  Other health care providers may not recommend any screening for nonpregnant women who are considered low risk for pelvic cancer and have no symptoms. Ask your health care provider if a screening pelvic exam is right for you.  If you have had past treatment for cervical cancer or a condition that could lead to cancer, you need Pap tests and screening for cancer for at least 20 years after your treatment. If Pap tests have been discontinued for you, your risk factors (such as having a new  sexual partner) need to be reassessed to determine if you should start having screenings again. Some women have medical problems that increase the chance of getting cervical cancer. In these cases, your health care provider may recommend that you have screening and Pap tests more often.  If you have a family history of uterine cancer or ovarian cancer, talk with your health care provider about genetic screening.  If you have vaginal bleeding after reaching menopause, tell your health care provider.  There are currently no reliable tests available to screen for ovarian cancer.  Lung Cancer Lung cancer screening is recommended for adults 55-80 years old who are at high risk for lung cancer because of a history of smoking. A yearly low-dose CT scan of the lungs is recommended if you:  Currently smoke.  Have a history of at least 30 pack-years of smoking and you currently smoke or have quit within the past 15 years. A pack-year is smoking an average of one pack of cigarettes per day for one year.  Yearly screening should:  Continue until it has been 15 years since you quit.  Stop if you develop a health problem that would prevent you from having lung cancer treatment.  Colorectal Cancer  This type of cancer can be detected and can often be prevented.  Routine   colorectal cancer screening usually begins at age 50 and continues through age 75.  If you have risk factors for colon cancer, your health care provider may recommend that you be screened at an earlier age.  If you have a family history of colorectal cancer, talk with your health care provider about genetic screening.  Your health care provider may also recommend using home test kits to check for hidden blood in your stool.  A small camera at the end of a tube can be used to examine your colon directly (sigmoidoscopy or colonoscopy). This is done to check for the earliest forms of colorectal cancer.  Direct examination of the  colon should be repeated every 5-10 years until age 75. However, if early forms of precancerous polyps or small growths are found or if you have a family history or genetic risk for colorectal cancer, you may need to be screened more often.  Skin Cancer  Check your skin from head to toe regularly.  Monitor any moles. Be sure to tell your health care provider: ? About any new moles or changes in moles, especially if there is a change in a mole's shape or color. ? If you have a mole that is larger than the size of a pencil eraser.  If any of your family members has a history of skin cancer, especially at a Kael Keetch age, talk with your health care provider about genetic screening.  Always use sunscreen. Apply sunscreen liberally and repeatedly throughout the day.  Whenever you are outside, protect yourself by wearing long sleeves, pants, a wide-brimmed hat, and sunglasses.  What should I know about osteoporosis? Osteoporosis is a condition in which bone destruction happens more quickly than new bone creation. After menopause, you may be at an increased risk for osteoporosis. To help prevent osteoporosis or the bone fractures that can happen because of osteoporosis, the following is recommended:  If you are 19-50 years old, get at least 1,000 mg of calcium and at least 600 mg of vitamin D per day.  If you are older than age 50 but younger than age 70, get at least 1,200 mg of calcium and at least 600 mg of vitamin D per day.  If you are older than age 70, get at least 1,200 mg of calcium and at least 800 mg of vitamin D per day.  Smoking and excessive alcohol intake increase the risk of osteoporosis. Eat foods that are rich in calcium and vitamin D, and do weight-bearing exercises several times each week as directed by your health care provider. What should I know about how menopause affects my mental health? Depression may occur at any age, but it is more common as you become older. Common  symptoms of depression include:  Low or sad mood.  Changes in sleep patterns.  Changes in appetite or eating patterns.  Feeling an overall lack of motivation or enjoyment of activities that you previously enjoyed.  Frequent crying spells.  Talk with your health care provider if you think that you are experiencing depression. What should I know about immunizations? It is important that you get and maintain your immunizations. These include:  Tetanus, diphtheria, and pertussis (Tdap) booster vaccine.  Influenza every year before the flu season begins.  Pneumonia vaccine.  Shingles vaccine.  Your health care provider may also recommend other immunizations. This information is not intended to replace advice given to you by your health care provider. Make sure you discuss any questions you have with your   health care provider. Document Released: 05/21/2005 Document Revised: 10/17/2015 Document Reviewed: 12/31/2014 Elsevier Interactive Patient Education  2018 [employer].

## 2016-12-15 LAB — PAP IG W/ RFLX HPV ASCU

## 2017-04-08 ENCOUNTER — Other Ambulatory Visit: Payer: Self-pay | Admitting: Women's Health

## 2017-04-08 DIAGNOSIS — Z1231 Encounter for screening mammogram for malignant neoplasm of breast: Secondary | ICD-10-CM

## 2017-04-13 ENCOUNTER — Other Ambulatory Visit: Payer: Self-pay | Admitting: Women's Health

## 2017-04-13 DIAGNOSIS — Z716 Tobacco abuse counseling: Secondary | ICD-10-CM

## 2017-04-13 NOTE — Telephone Encounter (Signed)
Ok for refill? 

## 2017-04-14 ENCOUNTER — Telehealth: Payer: Self-pay | Admitting: *Deleted

## 2017-04-14 NOTE — Telephone Encounter (Signed)
Prior authorization for Chantix continuing month pack 1 mg done iva cover my meds, will wait for response.

## 2017-04-15 ENCOUNTER — Encounter: Payer: Self-pay | Admitting: *Deleted

## 2017-05-02 ENCOUNTER — Ambulatory Visit
Admission: RE | Admit: 2017-05-02 | Discharge: 2017-05-02 | Disposition: A | Discharge status: Home / Self Care | Payer: 59 | Source: Ambulatory Visit | Attending: Women's Health | Admitting: Women's Health

## 2017-05-02 DIAGNOSIS — Z1231 Encounter for screening mammogram for malignant neoplasm of breast: Secondary | ICD-10-CM

## 2017-05-03 ENCOUNTER — Encounter: Payer: Self-pay | Admitting: Women's Health

## 2017-12-19 ENCOUNTER — Encounter: Payer: Self-pay | Admitting: Women's Health

## 2017-12-19 ENCOUNTER — Ambulatory Visit (INDEPENDENT_AMBULATORY_CARE_PROVIDER_SITE_OTHER): Payer: 59 | Admitting: Women's Health

## 2017-12-19 VITALS — BP 124/78 | Wt 180.0 lb

## 2017-12-19 DIAGNOSIS — Z01419 Encounter for gynecological examination (general) (routine) without abnormal findings: Secondary | ICD-10-CM

## 2017-12-19 DIAGNOSIS — Z716 Tobacco abuse counseling: Secondary | ICD-10-CM | POA: Diagnosis not present

## 2017-12-19 MED ORDER — VARENICLINE TARTRATE 1 MG PO TABS: 1.0000 mg | ORAL_TABLET | Freq: Two times a day (BID) | ORAL | 2 refills | Status: DC

## 2017-12-19 NOTE — Progress Notes (Signed)
Meredith Cox Dec 11, 1965 [id number]    History:    Presents for annual exam.  Monthly cycle/BTL-same partner.  Occasional night sweats.  Continues to smoke would like to try Chantix again.  2005 LGSIL with normal Paps after.  Normal mammogram history.  Has not had a screening colonoscopy.  Past medical history, past surgical history, family history and social history were all reviewed and documented in the EPIC chart.  Works at [employer].  2 daughters one lives in [place] with no children other daughter lives local with 5 children youngest 2 are twins age 4.  Taking entire family to [place] in December.  ROS:  A ROS was performed and pertinent positives and negatives are included.  Exam:  Vitals:   12/19/17 1554  BP: 124/78  Weight: 180 lb (81.6 kg)   Body mass index is 29.95 kg/m.   General appearance:  Normal Thyroid:  Symmetrical, normal in size, without palpable masses or nodularity. Respiratory  Auscultation:  Clear without wheezing or rhonchi Cardiovascular  Auscultation:  Regular rate, without rubs, murmurs or gallops  Edema/varicosities:  Not grossly evident Abdominal  Soft,nontender, without masses, guarding or rebound.  Liver/spleen:  No organomegaly noted  Hernia:  None appreciated  Skin  Inspection:  Grossly normal   Breasts: Examined lying and sitting.     Right: Without masses, retractions, discharge or axillary adenopathy.     Left: Without masses, retractions, discharge or axillary adenopathy. Gentitourinary   Inguinal/mons:  Normal without inguinal adenopathy  External genitalia:  Normal  BUS/Urethra/Skene's glands:  Normal  Vagina:  Normal  Cervix:  Normal  Uterus: normal in size, shape and contour.  Midline and mobile  Adnexa/parametria:     Rt: Without masses or tenderness.   Lt: Without masses or tenderness.  Anus and perineum: Normal  Digital rectal exam: Normal sphincter tone without palpated masses or  tenderness  Assessment/Plan:  52 y.o. SBF G4, P2 for annual exam with occasional hot flashes.  Monthly cycle/BTL Smoker 2005 LGSIL cryo -  normal Paps after  Plan: Chantix milligrams twice daily prescription, proper use given and reviewed.  Reviewed importance of start date and quitting.  SBE's, continue annual screening mammogram, [facility] information given instructed to schedule screening colonoscopy.  Encouraged to increase regular weightbearing and balance type exercise.  Vitamin D 2000 daily encouraged.  Pap normal with negative HR HPV typing 2018, new screening guidelines reviewed.   Harrington Challenger [facility], 5:11 PM 12/19/2017

## 2017-12-19 NOTE — Patient Instructions (Addendum)

## 2017-12-22 ENCOUNTER — Telehealth: Payer: Self-pay | Admitting: *Deleted

## 2017-12-22 NOTE — Telephone Encounter (Signed)
Prior Authorization denied by [employer] for Chantix 1 mg pack, medication is only covered if she has history of taking bupropion (generic Zyban). Is this an option for patient? Please advise

## 2017-12-22 NOTE — Telephone Encounter (Signed)
Yes okay for generic zyban ( I think 100mg ) 1 tablet daily for 1 week and then 2 tablets daily, #90

## 2017-12-23 MED ORDER — BUPROPION HCL ER (SR) 100 MG PO TB12: ORAL_TABLET | ORAL | 0 refills | Status: DC

## 2017-12-23 NOTE — Telephone Encounter (Signed)
Patient aware, Rx sent.  

## 2018-02-03 ENCOUNTER — Other Ambulatory Visit: Payer: Self-pay | Admitting: Women's Health

## 2018-02-03 NOTE — Telephone Encounter (Signed)
Pl call if on chantix, similar med, should not be on both, is she on to help stop smoking if yes ok for 90 day refill if not on chantix

## 2018-05-09 ENCOUNTER — Other Ambulatory Visit: Payer: Self-pay | Admitting: Women's Health

## 2018-05-09 DIAGNOSIS — Z1231 Encounter for screening mammogram for malignant neoplasm of breast: Secondary | ICD-10-CM

## 2018-06-05 ENCOUNTER — Ambulatory Visit
Admission: RE | Admit: 2018-06-05 | Discharge: 2018-06-05 | Disposition: A | Discharge status: Home / Self Care | Payer: 59 | Source: Ambulatory Visit | Attending: Women's Health | Admitting: Women's Health

## 2018-06-05 ENCOUNTER — Encounter: Payer: Self-pay | Admitting: Radiology

## 2018-06-05 DIAGNOSIS — Z1231 Encounter for screening mammogram for malignant neoplasm of breast: Secondary | ICD-10-CM

## 2018-06-14 ENCOUNTER — Other Ambulatory Visit: Payer: Self-pay

## 2018-06-14 ENCOUNTER — Encounter: Payer: Self-pay | Admitting: Internal Medicine

## 2018-06-14 ENCOUNTER — Ambulatory Visit: Payer: 59 | Admitting: Internal Medicine

## 2018-06-14 VITALS — BP 122/80 | HR 83 | Ht 65.5 in | Wt 175.0 lb

## 2018-06-14 DIAGNOSIS — R7989 Other specified abnormal findings of blood chemistry: Secondary | ICD-10-CM | POA: Diagnosis not present

## 2018-06-14 LAB — TSH: TSH: 0.02 u[IU]/mL — ABNORMAL LOW (ref 0.35–4.50)

## 2018-06-14 LAB — T4, FREE: FREE T4: 0.88 ng/dL (ref 0.60–1.60)

## 2018-06-14 NOTE — Progress Notes (Signed)
Name: Meredith Cox  MRN/ DOB: [id number], [birth year]    Age/ Sex: 53 y.o., female    PCP: Ernestina Penna, MD   Reason for Endocrinology Evaluation: Subclinical hyperthyroidism      Date of Initial Endocrinology Evaluation: 06/14/2018     HPI: Meredith Cox is a 53 y.o. female with a past medical history of HTN. The patient presented for initial endocrinology clinic visit on 06/14/2018 for consultative assistance with her low TSH .   She was found to have a suppressed TSH at <0.006 uIU/mL during routine blood work . She was having sinus infection at the time.  She was having weight gain at the time associated with fatigue,   Denies diarrhea, but has palpitations  Denies anxiety but has occasional jittery sensation denies tremors.   She denies local neck symptoms such as pain or swelling.   She does blurry vision and itching of the eyes.   No period for the past 8 months.    Mother with thyroid disease.      HISTORY:   Past Medical History: History reviewed. No pertinent past medical history. Past Surgical History:  Past Surgical History:  Procedure Laterality Date  . TONSILLECTOMY AND ADENOIDECTOMY    . TUBAL LIGATION        Social History:  reports that she has been smoking cigarettes. She has been smoking about 0.50 packs per day. She has never used smokeless tobacco. She reports current alcohol use. She reports that she does not use drugs.  Family History: family history includes Breast cancer (age of onset: 44) in her maternal aunt; Cancer in her maternal aunt; Thyroid disease in her maternal grandmother and mother.   HOME MEDICATIONS: Allergies as of 06/14/2018   No Known Allergies     Medication List       Accurate as of June 14, 2018  8:32 AM. Always use your most recent med list.        cetirizine 10 MG tablet Commonly known as:  ZYRTEC Take 10 mg by mouth daily.   fluticasone 50 MCG/ACT nasal spray Commonly known as:  FLONASE Place into  both nostrils daily.   lisinopril 5 MG tablet Commonly known as:  PRINIVIL,ZESTRIL Take 5 mg by mouth daily.   varenicline 1 MG tablet Commonly known as:  CHANTIX Take 1 mg by mouth 2 (two) times daily.   varenicline 1 MG tablet Commonly known as:  CHANTIX CONTINUING MONTH PAK Take 1 tablet (1 mg total) by mouth 2 (two) times daily.         REVIEW OF SYSTEMS: A comprehensive ROS was conducted with the patient and is negative except as per HPI and below:  Review of Systems  Constitutional: Positive for malaise/fatigue. Negative for weight loss.  HENT: Negative for congestion and sore throat.   Eyes: Positive for blurred vision and pain.  Respiratory: Negative for cough and shortness of breath.   Cardiovascular: Positive for palpitations. Negative for chest pain.  Gastrointestinal: Positive for constipation. Negative for nausea.  Genitourinary: Positive for frequency.  Skin: Negative.   Neurological: Negative for tingling and tremors.  Endo/Heme/Allergies: Positive for polydipsia.  Psychiatric/Behavioral: Negative for depression. The patient is not nervous/anxious.        OBJECTIVE:  VS: BP 122/80   Pulse 83   Ht 5' 5.5" (1.664 m)   Wt 175 lb (79.4 kg)   LMP 12/12/2017   SpO2 98%   BMI 28.68 kg/m    Wt  Readings from Last 3 Encounters:  06/14/18 175 lb (79.4 kg)  12/19/17 180 lb (81.6 kg)  12/14/16 175 lb (79.4 kg)     EXAM: General: Pt appears well and is in NAD  Hydration: Well-hydrated with moist mucous membranes and good skin turgor  Eyes: External eye exam normal without stare, lid lag or exophthalmos.  EOM intact.    Ears, Nose, Throat: Hearing: Grossly intact bilaterally Throat: Clear without mass, erythema or exudate  Neck: General: Supple without adenopathy. Thyroid: Thyroid gland is prominent.  No goiter or nodules appreciated. No thyroid bruit.  Lungs: Clear with good BS bilat with no rales, rhonchi, or wheezes  Heart: Auscultation: RRR.    Abdomen: Normoactive bowel sounds, soft, nontender, without masses or organomegaly palpable  Extremities:  BL LE: No pretibial edema normal ROM and strength.  Skin: Hair: Texture and amount normal with gender appropriate distribution Skin Inspection: No rashes. Skin Palpation: Skin temperature, texture, and thickness normal to palpation  Neuro: Cranial nerves: II - XII grossly intact  Motor: Normal strength throughout DTRs: 2+ and symmetric in UE without delay in relaxation phase  Mental Status: Judgment, insight: Intact Orientation: Oriented to time, place, and person Mood and affect: No depression, anxiety, or agitation     DATA REVIEWED: 05/23/2018  TSH < 0.006 uIU/mL AST 14 IU/L ALT 25 IU/L WBC 10.5  Neutrophils 56 %  Lymphs 35   ASSESSMENT/PLAN/RECOMMENDATIONS:   1. Subclinical hyperthyroidism  - The causes of subclinical hyperthyroidism are the same as the causes of overt hyperthyroidism, and like overt hyperthyroidism, subclinical hyperthyroidism can be persistent or transient. Common causes of subclinical hyperthyroidism include excessive thyroid hormone therapy (exogenous subclinical hyperthyroidism), autonomously functioning thyroid adenomas and multinodular goiters (endogenous subclinical hyperthyroidism), or Graves' disease (endogenous subclinical hyperthyroidism).   Most patients with subclinical hyperthyroidism have no clinical manifestations of hyperthyroidism, and those symptoms that are present (eg, tachycardia, tremor, dyspnea on exertion, weight loss) are mild and nonspecific." However, subclinical hyperthyroidism is associated with an increased risk of atrial fibrillation and, primarily in postmenopausal women, a decrease in bone mineral density.   -Repeat TFTs today show normal T4, normal T3, and her TSH has went from 0.006 to 0.02 uIU/mL .  No intervention is needed at this point.  We will just continue to monitor with serial TFTs.    F/U in 6 weeks     Signed electronically by: Lyndle Herrlich, MD  [facility] Endocrinology  [facility] [address] [place], [place] [address] Phone: [phone] FAX: [phone]   CC: Ernestina Penna, MD [address] [place] [address] Phone: [phone] Fax: [phone]   Return to Endocrinology clinic as below: Future Appointments  Date Time Provider Department Center  07/10/2018  9:30 AM Roma Kayser, MD [facility] None  12/25/2018  9:00 AM Harrington Challenger, NP [facility]

## 2018-06-14 NOTE — Patient Instructions (Signed)
-   Please stop by the lab today, we will contact you with any results.

## 2018-06-15 ENCOUNTER — Telehealth: Payer: Self-pay

## 2018-06-15 NOTE — Telephone Encounter (Signed)
NOTES ON FILE 

## 2018-06-17 LAB — TRAB (TSH RECEPTOR BINDING ANTIBODY): TRAB: 10.65 IU/L — AB (ref <=2.00)

## 2018-06-17 LAB — T3: T3 TOTAL: 120 ng/dL (ref 76–181)

## 2018-06-19 ENCOUNTER — Telehealth: Payer: Self-pay | Admitting: Internal Medicine

## 2018-06-19 NOTE — Telephone Encounter (Addendum)
Spoke to the patient on 06/19/2018 at 9:45 AM about test results below  Results for BROOKLAN, DUSEK (MRN [id number]) as of 06/19/2018 09:48  Ref. Range 06/14/2018 08:49  TRAB Latest Ref Range: <=2.00 IU/L 10.65 (H)     Results for VONDELL, TROTTIER (MRN [id number]) as of 06/19/2018 09:48  Ref. Range 06/14/2018 08:49  TSH Latest Ref Range: 0.35 - 4.50 uIU/mL 0.02 (L)  Triiodothyronine (T3) Latest Ref Range: 76 - 181 ng/dL 453  M4,WOEH(OZYYQM) Latest Ref Range: 0.60 - 1.60 ng/dL 2.50    Based on the elevated TRAb levels she has Graves' disease. Her TSH is improving  Recommendations No need to treat at this point we can just monitor her TFTs and symptoms.   Patient expressed understanding of the above.  Abby Raelyn Mora, MD  [facility] Endocrinology  [facility] [address] [place], [place] [address] Phone: [phone] FAX: [phone]

## 2018-07-03 ENCOUNTER — Telehealth: Payer: Self-pay | Admitting: Cardiovascular Disease

## 2018-07-03 NOTE — Telephone Encounter (Signed)
Rescheduled to 07/26/2018.

## 2018-07-03 NOTE — Telephone Encounter (Signed)
I called and spoke with the patient about potentially rescheduling her appointment due to the COVID19 pandemic.  She said she is feeling well and she is very agreeable to rescheduling.  I will have my office staff call her back with a new appointment date.

## 2018-07-07 ENCOUNTER — Ambulatory Visit: Payer: 59 | Admitting: Cardiovascular Disease

## 2018-07-10 ENCOUNTER — Ambulatory Visit: Payer: 59 | Admitting: "Endocrinology

## 2018-07-24 ENCOUNTER — Telehealth: Payer: Self-pay | Admitting: *Deleted

## 2018-07-24 NOTE — Telephone Encounter (Signed)
Contacted patient for review of chart for virtual visit [facility]) on 07/26/2018 with Dr. Purvis Sheffield at 10:00    am - NEW PATIENT.    She declined visit when informed that her insurance would be billed for the visit.  Appointment rescheduled for 10/16/2018.  Also, placed on wait list for when we can start seeing patients back in the office.

## 2018-07-26 ENCOUNTER — Ambulatory Visit: Payer: 59 | Admitting: Internal Medicine

## 2018-07-26 ENCOUNTER — Telehealth: Payer: 59 | Admitting: Cardiovascular Disease

## 2018-08-21 ENCOUNTER — Telehealth: Payer: Self-pay

## 2018-08-21 NOTE — Telephone Encounter (Signed)
ATC the patient to se if she would like to change her in office appointment to a virtual appointment. Unable to leave a voice mail.  CRM created.

## 2018-08-22 ENCOUNTER — Ambulatory Visit: Payer: 59 | Admitting: Internal Medicine

## 2018-08-22 NOTE — Progress Notes (Deleted)
Name: Meredith Cox  MRN/ DOB: [id number], 1966/03/24    Age/ Sex: 53 y.o., female     PCP: Gavin Potters   Reason for Endocrinology Evaluation: Subclinical hyperthyroidism      Initial Endocrinology Clinic Visit: 06/14/2018    PATIENT IDENTIFIER: Meredith Cox is a 53 y.o., female with a past medical history of HTN. She has followed with [facility] since 06/14/2018 for consultative assistance with management of her subclinical hyperthyroidism   HISTORICAL SUMMARY: The patient was first diagnosed with subclinical hyperthyroidism in February, 2020 when she was found to have suppressed TSH  at <0.006 uIU/mL during routine blood work. She was having fatigue and weight gain at the time as well as a sinus infection.   Mother with thyroid disease.  SUBJECTIVE:   During last visit (06/14/2018): TSH was improving with normal T3 and T4, no intervention needed.   Today (08/22/2018):  Ms. Norberto is here for a 6 week follow up on subclinical hyperthyroidism.    ROS:  As per HPI.   HISTORY:   Past Medical History: No past medical history on file. Past Surgical History:  Past Surgical History:  Procedure Laterality Date  . TONSILLECTOMY AND ADENOIDECTOMY    . TUBAL LIGATION       Social History:  reports that she has been smoking cigarettes. She has been smoking about 0.50 packs per day. She has never used smokeless tobacco. She reports current alcohol use. She reports that she does not use drugs. Family History:  Family History  Problem Relation Age of Onset  . Thyroid disease Mother   . Breast cancer Maternal Aunt 62  . Cancer Maternal Aunt        Cervical  . Thyroid disease Maternal Grandmother       HOME MEDICATIONS: Allergies as of 08/22/2018   No Known Allergies     Medication List       Accurate as of Aug 22, 2018  8:00 AM. If you have any questions, ask your nurse or doctor.        cetirizine 10 MG tablet Commonly known as:  ZYRTEC  Take 10 mg by mouth daily.   fluticasone 50 MCG/ACT nasal spray Commonly known as:  FLONASE Place into both nostrils daily.   lisinopril 5 MG tablet Commonly known as:  ZESTRIL Take 5 mg by mouth daily.   varenicline 1 MG tablet Commonly known as:  CHANTIX Take 1 mg by mouth 2 (two) times daily.   varenicline 1 MG tablet Commonly known as:  Chantix Continuing Month Pak Take 1 tablet (1 mg total) by mouth 2 (two) times daily.         OBJECTIVE:   PHYSICAL EXAM: VS: LMP 12/12/2017    EXAM: General: Pt appears well and is in NAD  Hydration: Well-hydrated with moist mucous membranes and good skin turgor  Eyes: External eye exam normal without stare, lid lag or exophthalmos.  EOM intact.  PERRL.  Ears, Nose, Throat: Hearing: Grossly intact bilaterally Dental: Good dentition  Throat: Clear without mass, erythema or exudate  Neck: General: Supple without adenopathy. Thyroid: Thyroid size normal.  No goiter or nodules appreciated. No thyroid bruit.  Lungs: Clear with good BS bilat with no rales, rhonchi, or wheezes  Heart: Auscultation: RRR.  Abdomen: Normoactive bowel sounds, soft, nontender, without masses or organomegaly palpable  Extremities: Gait and station: Normal gait  Digits and nails: No clubbing, cyanosis, petechiae, or nodes Head and neck: Normal  alignment and mobility BL UE: Normal ROM and strength. BL LE: No pretibial edema normal ROM and strength.  Skin: Hair: Texture and amount normal with gender appropriate distribution Skin Inspection: No rashes, acanthosis nigricans/skin tags. No lipohypertrophy Skin Palpation: Skin temperature, texture, and thickness normal to palpation  Neuro: Cranial nerves: II - XII grossly intact  Cerebellar: Normal coordination and movement; no tremor Motor: Normal strength throughout DTRs: 2+ and symmetric in UE without delay in relaxation phase  Mental Status: Judgment, insight: Intact Orientation: Oriented to time, place, and  person Memory: Intact for recent and remote events Mood and affect: No depression, anxiety, or agitation     DATA REVIEWED: ***  Results for Meredith FreudMOORE, Kally M (MRN [id number]) as of 08/22/2018 08:00  Ref. Range 06/14/2018 08:49  [facility] Latest Ref Range: <=2.00 IU/L 10.65 (H)    ASSESSMENT / PLAN / RECOMMENDATIONS:   1. Subclinical Graves' Disease:  Plan:  ***    Medications   ***   Signed electronically by: Lyndle HerrlichAbby Jaralla Shamleffer, MD  [facility]  [facility] [address] [place], [place] [address] Phone: [phone] FAX: [phone]      CC: Gavin PottersHess, Kristin M, PA-C [address] [place] [address] Phone: [phone]  Fax: [phone]   Return to Endocrinology clinic as below: Future Appointments  Date Time Provider Department Center  08/22/2018  8:30 AM Shamleffer, Konrad DoloresIbtehal Jaralla, MD [facility] None  09/18/2018  9:30 AM Roma KayserNida, Gebreselassie W, MD [facility] None  10/16/2018  8:40 AM Laqueta LindenKoneswaran, Suresh A, MD [facility]  12/25/2018  9:00 AM Harrington ChallengerYoung, Nancy J, NP [facility]

## 2018-09-18 ENCOUNTER — Ambulatory Visit: Payer: 59 | Admitting: "Endocrinology

## 2018-10-12 ENCOUNTER — Encounter: Payer: Self-pay | Admitting: *Deleted

## 2018-10-16 ENCOUNTER — Ambulatory Visit: Payer: 59 | Admitting: Cardiovascular Disease

## 2018-12-22 ENCOUNTER — Other Ambulatory Visit: Payer: Self-pay

## 2018-12-25 ENCOUNTER — Encounter: Payer: Self-pay | Admitting: Women's Health

## 2018-12-25 ENCOUNTER — Other Ambulatory Visit: Payer: Self-pay

## 2018-12-25 ENCOUNTER — Ambulatory Visit: Payer: 59 | Admitting: Women's Health

## 2018-12-25 VITALS — BP 122/80 | Ht 65.0 in | Wt 173.0 lb

## 2018-12-25 DIAGNOSIS — Z01419 Encounter for gynecological examination (general) (routine) without abnormal findings: Secondary | ICD-10-CM

## 2018-12-25 NOTE — Patient Instructions (Signed)
Coping with Quitting Smoking ° °Quitting smoking is a physical and mental challenge. You will face cravings, withdrawal symptoms, and temptation. Before quitting, work with your health care provider to make a plan that can help you cope. Preparation can help you quit and keep you from giving in. °How can I cope with cravings? °Cravings usually last for 5-10 minutes. If you get through it, the craving will pass. Consider taking the following actions to help you cope with cravings: °· Keep your mouth busy: °? Chew sugar-free gum. °? Suck on hard candies or a straw. °? Brush your teeth. °· Keep your hands and body busy: °? Immediately change to a different activity when you feel a craving. °? Squeeze or play with a ball. °? Do an activity or a hobby, like making bead jewelry, practicing needlepoint, or working with wood. °? Mix up your normal routine. °? Take a short exercise break. Go for a quick walk or run up and down stairs. °? Spend time in public places where smoking is not allowed. °· Focus on doing something kind or helpful for someone else. °· Call a friend or family member to talk during a craving. °· Join a support group. °· Call a quit line, such as 1-800-QUIT-NOW. °· Talk with your health care provider about medicines that might help you cope with cravings and make quitting easier for you. °How can I deal with withdrawal symptoms? °Your body may experience negative effects as it tries to get used to not having nicotine in the system. These effects are called withdrawal symptoms. They may include: °· Feeling hungrier than normal. °· Trouble concentrating. °· Irritability. °· Trouble sleeping. °· Feeling depressed. °· Restlessness and agitation. °· Craving a cigarette. °To manage withdrawal symptoms: °· Avoid places, people, and activities that trigger your cravings. °· Remember why you want to quit. °· Get plenty of sleep. °· Avoid coffee and other caffeinated drinks. These may worsen some of your  symptoms. °How can I handle social situations? °Social situations can be difficult when you are quitting smoking, especially in the first few weeks. To manage this, you can: °· Avoid parties, bars, and other social situations where people might be smoking. °· Avoid alcohol. °· Leave right away if you have the urge to smoke. °· Explain to your family and friends that you are quitting smoking. Ask for understanding and support. °· Plan activities with friends or family where smoking is not an option. °What are some ways I can cope with stress? °Wanting to smoke may cause stress, and stress can make you want to smoke. Find ways to manage your stress. Relaxation techniques can help. For example: °· Breathe slowly and deeply, in through your nose and out through your mouth. °· Listen to soothing, relaxing music. °· Talk with a family member or friend about your stress. °· Light a candle. °· Soak in a bath or take a shower. °· Think about a peaceful place. °What are some ways I can prevent weight gain? °Be aware that many people gain weight after they quit smoking. However, not everyone does. To keep from gaining weight, have a plan in place before you quit and stick to the plan after you quit. Your plan should include: °· Having healthy snacks. When you have a craving, it may help to: °? Eat plain popcorn, crunchy carrots, celery, or other cut vegetables. °? Chew sugar-free gum. °· Changing how you eat: °? Eat small portion sizes at meals. °? Eat 4-6 small meals   throughout the day instead of 1-2 large meals a day. ? Be mindful when you eat. Do not watch television or do other things that might distract you as you eat.  Exercising regularly: ? Make time to exercise each day. If you do not have time for a long workout, do short bouts of exercise for 5-10 minutes several times a day. ? Do some form of strengthening exercise, like weight lifting, and some form of aerobic exercise, like running or swimming.  Drinking  plenty of water or other low-calorie or no-calorie drinks. Drink 6-8 glasses of water daily, or as much as instructed by your health care provider. Summary  Quitting smoking is a physical and mental challenge. You will face cravings, withdrawal symptoms, and temptation to smoke again. Preparation can help you as you go through these challenges.  You can cope with cravings by keeping your mouth busy (such as by chewing gum), keeping your body and hands busy, and making calls to family, friends, or a helpline for people who want to quit smoking.  You can cope with withdrawal symptoms by avoiding places where people smoke, avoiding drinks with caffeine, and getting plenty of rest.  Ask your health care provider about the different ways to prevent weight gain, avoid stress, and handle social situations. This information is not intended to replace advice given to you by your health care provider. Make sure you discuss any questions you have with your health care provider. Document Released: 03/26/2016 Document Revised: 03/11/2017 Document Reviewed: 03/26/2016 Elsevier Patient Education  2020 [employer]. Health Maintenance for Postmenopausal Women Menopause is a normal process in which your ability to get pregnant comes to an end. This process happens slowly over many months or years, usually between the ages of 7 and 62. Menopause is complete when you have missed your menstrual periods for 12 months. It is important to talk with your health care provider about some of the most common conditions that affect women after menopause (postmenopausal women). These include heart disease, cancer, and bone loss (osteoporosis). Adopting a healthy lifestyle and getting preventive care can help to promote your health and wellness. The actions you take can also lower your chances of developing some of these common conditions. What should I know about menopause? During menopause, you may get a number of symptoms,  such as:  Hot flashes. These can be moderate or severe.  Night sweats.  Decrease in sex drive.  Mood swings.  Headaches.  Tiredness.  Irritability.  Memory problems.  Insomnia. Choosing to treat or not to treat these symptoms is a decision that you make with your health care provider. Do I need hormone replacement therapy?  Hormone replacement therapy is effective in treating symptoms that are caused by menopause, such as hot flashes and night sweats.  Hormone replacement carries certain risks, especially as you become older. If you are thinking about using estrogen or estrogen with progestin, discuss the benefits and risks with your health care provider. What is my risk for heart disease and stroke? The risk of heart disease, heart attack, and stroke increases as you age. One of the causes may be a change in the body's hormones during menopause. This can affect how your body uses dietary fats, triglycerides, and cholesterol. Heart attack and stroke are medical emergencies. There are many things that you can do to help prevent heart disease and stroke. Watch your blood pressure  High blood pressure causes heart disease and increases the risk of stroke. This is  more likely to develop in people who have high blood pressure readings, are of African descent, or are overweight.  Have your blood pressure checked: ? Every 3-5 years if you are 40-81 years of age. ? Every year if you are 54 years old or older. Eat a healthy diet   Eat a diet that includes plenty of vegetables, fruits, low-fat dairy products, and lean protein.  Do not eat a lot of foods that are high in solid fats, added sugars, or sodium. Get regular exercise Get regular exercise. This is one of the most important things you can do for your health. Most adults should:  Try to exercise for at least 150 minutes each week. The exercise should increase your heart rate and make you sweat (moderate-intensity  exercise).  Try to do strengthening exercises at least twice each week. Do these in addition to the moderate-intensity exercise.  Spend less time sitting. Even light physical activity can be beneficial. Other tips  Work with your health care provider to achieve or maintain a healthy weight.  Do not use any products that contain nicotine or tobacco, such as cigarettes, e-cigarettes, and chewing tobacco. If you need help quitting, ask your health care provider.  Know your numbers. Ask your health care provider to check your cholesterol and your blood sugar (glucose). Continue to have your blood tested as directed by your health care provider. Do I need screening for cancer? Depending on your health history and family history, you may need to have cancer screening at different stages of your life. This may include screening for:  Breast cancer.  Cervical cancer.  Lung cancer.  Colorectal cancer. What is my risk for osteoporosis? After menopause, you may be at increased risk for osteoporosis. Osteoporosis is a condition in which bone destruction happens more quickly than new bone creation. To help prevent osteoporosis or the bone fractures that can happen because of osteoporosis, you may take the following actions:  If you are 64-62 years old, get at least 1,000 mg of calcium and at least 600 mg of vitamin D per day.  If you are older than age 41 but younger than age 104, get at least 1,200 mg of calcium and at least 600 mg of vitamin D per day.  If you are older than age 1, get at least 1,200 mg of calcium and at least 800 mg of vitamin D per day. Smoking and drinking excessive alcohol increase the risk of osteoporosis. Eat foods that are rich in calcium and vitamin D, and do weight-bearing exercises several times each week as directed by your health care provider. How does menopause affect my mental health? Depression may occur at any age, but it is more common as you become older.  Common symptoms of depression include:  Low or sad mood.  Changes in sleep patterns.  Changes in appetite or eating patterns.  Feeling an overall lack of motivation or enjoyment of activities that you previously enjoyed.  Frequent crying spells. Talk with your health care provider if you think that you are experiencing depression. General instructions See your health care provider for regular wellness exams and vaccines. This may include:  Scheduling regular health, dental, and eye exams.  Getting and maintaining your vaccines. These include: ? Influenza vaccine. Get this vaccine each year before the flu season begins. ? Pneumonia vaccine. ? Shingles vaccine. ? Tetanus, diphtheria, and pertussis (Tdap) booster vaccine. Your health care provider may also recommend other immunizations. Tell your health care provider  if you have ever been abused or do not feel safe at home. Summary  Menopause is a normal process in which your ability to get pregnant comes to an end.  This condition causes hot flashes, night sweats, decreased interest in sex, mood swings, headaches, or lack of sleep.  Treatment for this condition may include hormone replacement therapy.  Take actions to keep yourself healthy, including exercising regularly, eating a healthy diet, watching your weight, and checking your blood pressure and blood sugar levels.  Get screened for cancer and depression. Make sure that you are up to date with all your vaccines. This information is not intended to replace advice given to you by your health care provider. Make sure you discuss any questions you have with your health care provider. Document Released: 05/21/2005 Document Revised: 03/22/2018 Document Reviewed: 03/22/2018 Elsevier Patient Education  2020 [employer]

## 2018-12-25 NOTE — Progress Notes (Signed)
Meredith Cox 09-13-1965 [id number]    History:    Presents for annual exam.  Continues to have cycles every few months, last cycle April, occasional hot flushes.  BTL.  2005 LGSIL with normal Paps after.  Primary care managing labs and meds continues to smoke less than half pack cigarettes daily.  2020- small polyp on colonoscopy 5-year follow-up.  Same partner   Past medical history, past surgical history, family history and social history were all reviewed and documented in the EPIC chart.  Works at [employer].  Daughter has 5 children youngest are 106-year-old twins.  Son lives local.  ROS:  A ROS was performed and pertinent positives and negatives are included.  Exam:  Vitals:   12/25/18 0905  BP: 122/80  Weight: 173 lb (78.5 kg)  Height: 5\' 5"  (1.651 m)   Body mass index is 28.79 kg/m.   General appearance:  Normal Thyroid:  Symmetrical, normal in size, without palpable masses or nodularity. Respiratory  Auscultation:  Clear without wheezing or rhonchi Cardiovascular  Auscultation:  Regular rate, without rubs, murmurs or gallops  Edema/varicosities:  Not grossly evident Abdominal  Soft,nontender, without masses, guarding or rebound.  Liver/spleen:  No organomegaly noted  Hernia:  None appreciated  Skin  Inspection:  Grossly normal   Breasts: Examined lying and sitting.     Right: Without masses, retractions, discharge or axillary adenopathy.     Left: Without masses, retractions, discharge or axillary adenopathy. Gentitourinary   Inguinal/mons:  Normal without inguinal adenopathy  External genitalia:  Normal  BUS/Urethra/Skene's glands:  Normal  Vagina:  Normal  Cervix:  Normal  Uterus:   normal in size, shape and contour.  Midline and mobile  Adnexa/parametria:     Rt: Without masses or tenderness.   Lt: Without masses or tenderness.  Anus and perineum: Normal  Digital rectal exam: Normal sphincter tone without palpated masses or  tenderness  Assessment/Plan:  53 y.o. SBF G4, P2 for annual exam with no complaints.  Perimenopausal/BTL 2005 LGSIL with normal Paps after Labs-primary care Smoker less than half pack daily  Plan: SBEs, continue annual screening mammogram, calcium rich foods, vitamin D 2000 daily encouraged.  Reviewed importance of weightbearing and balance type exercise.  Menopause reviewed.  Aware of hazards of smoking is trying to quit tips discussed.  Pap with HR HPV typing, new screening guidelines reviewed.    [place], 9:14 AM 12/25/2018

## 2018-12-26 LAB — PAP, TP IMAGING W/ HPV RNA, RFLX HPV TYPE 16,18/45: HPV DNA High Risk: NOT DETECTED

## 2019-07-05 ENCOUNTER — Ambulatory Visit: Payer: 59 | Attending: Family

## 2019-07-05 DIAGNOSIS — Z23 Encounter for immunization: Secondary | ICD-10-CM

## 2019-07-05 NOTE — Progress Notes (Signed)
   Covid-19 Vaccination Clinic  Name:  Meredith Cox    MRN: [id number] DOB: 1966-01-29  07/05/2019  [name] was observed post Covid-19 immunization for 15 minutes without incident. She was provided with Vaccine Information Sheet and instruction to access the V-Safe system.   [name] was instructed to call 911 with any severe reactions post vaccine: Marland Kitchen Difficulty breathing  . Swelling of face and throat  . A fast heartbeat  . A bad rash all over body  . Dizziness and weakness   Immunizations Administered    Name Date Dose VIS Date Route   Moderna COVID-19 Vaccine 07/05/2019 10:44 AM 0.5 mL 03/13/2019 Intramuscular   Manufacturer: Moderna   Lot: [id number]   NDC: 09983-382-50

## 2019-08-07 ENCOUNTER — Ambulatory Visit: Payer: 59 | Attending: Family

## 2019-08-07 DIAGNOSIS — Z23 Encounter for immunization: Secondary | ICD-10-CM

## 2019-08-07 NOTE — Progress Notes (Signed)
   Covid-19 Vaccination Clinic  Name:  EVAGELIA KNACK    MRN: [id number] DOB: [birth year]  08/07/2019  Ms. Roesler was observed post Covid-19 immunization for 15 minutes without incident. She was provided with Vaccine Information Sheet and instruction to access the V-Safe system.   Ms. Pellot was instructed to call 911 with any severe reactions post vaccine: Marland Kitchen Difficulty breathing  . Swelling of face and throat  . A fast heartbeat  . A bad rash all over body  . Dizziness and weakness   Immunizations Administered    Name Date Dose VIS Date Route   Moderna COVID-19 Vaccine 08/07/2019 10:21 AM 0.5 mL 03/2019 Intramuscular   Manufacturer: Moderna   Lot: [id number]   NDC: 03709-643-83

## 2019-12-11 IMAGING — MG DIGITAL SCREENING BILATERAL MAMMOGRAM WITH TOMO AND CAD
6 series · 6 of 22 positions shown · non-contrast
Comparison: Previous exam(s).

CLINICAL DATA: Screening.

EXAM:
DIGITAL SCREENING BILATERAL MAMMOGRAM WITH TOMO AND CAD

[L CC synth-2D]
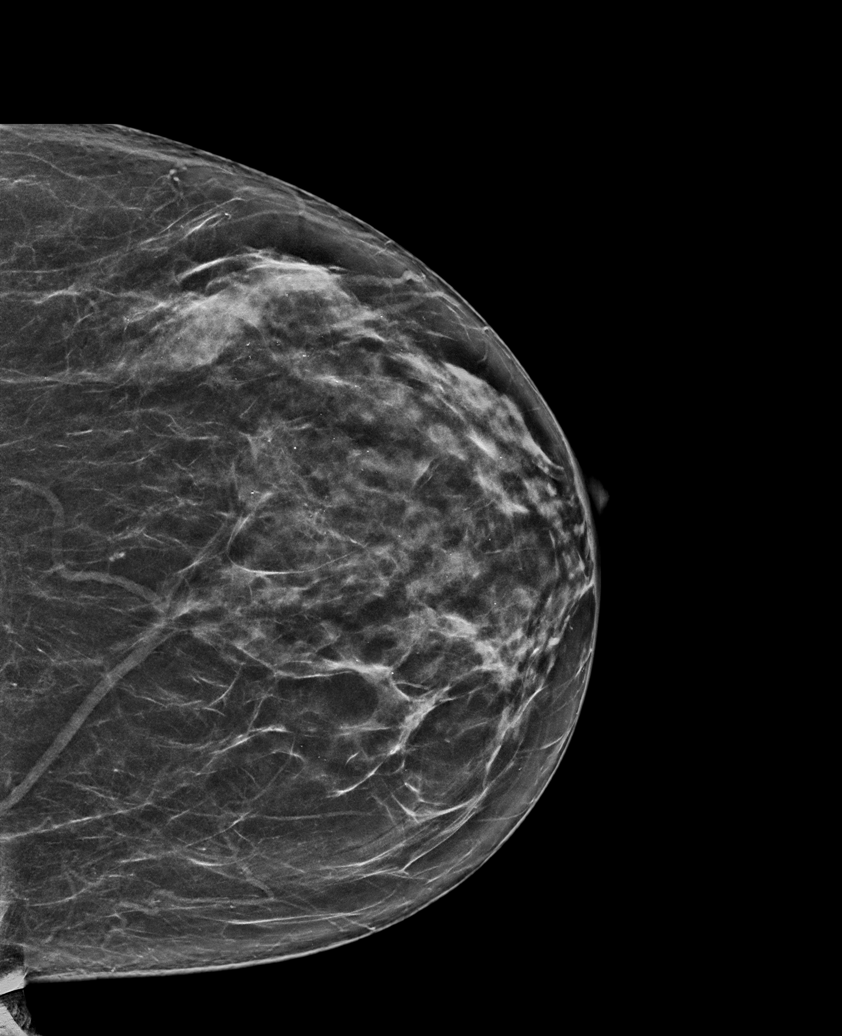

[R MLO synth-2D]
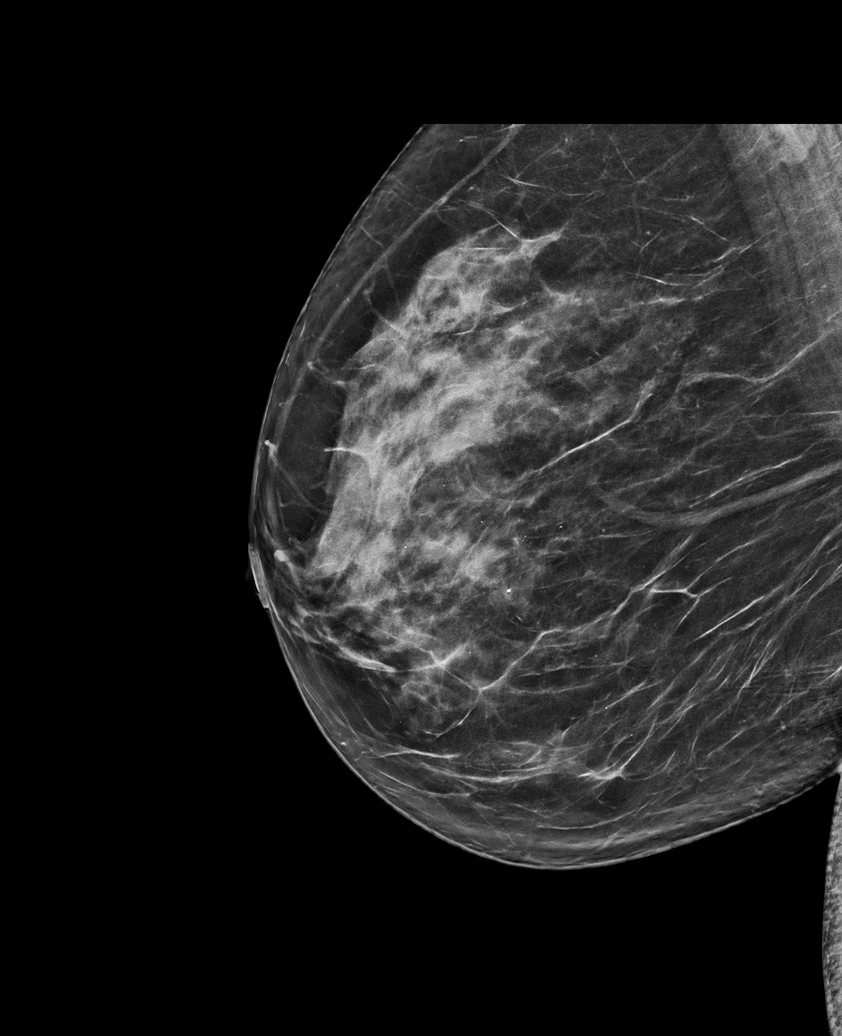

[L CC tomo · tomo slice 37/73.0]
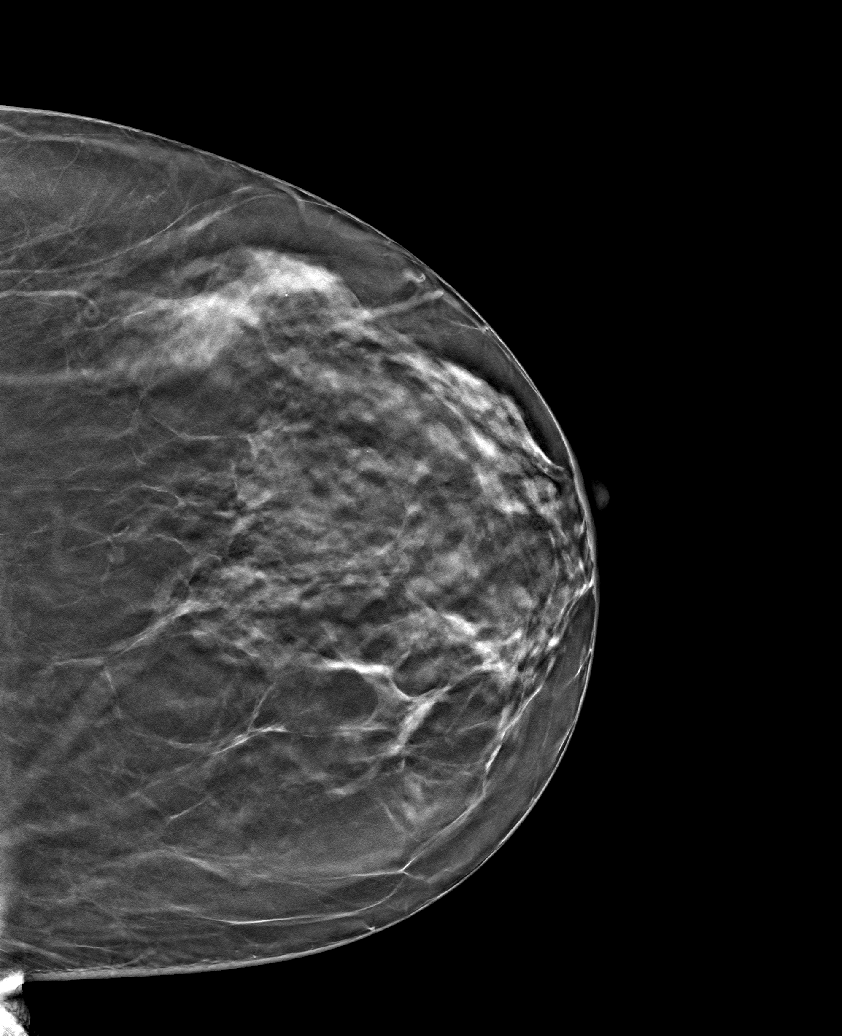

[R MLO tomo · tomo slice 39/78.0]
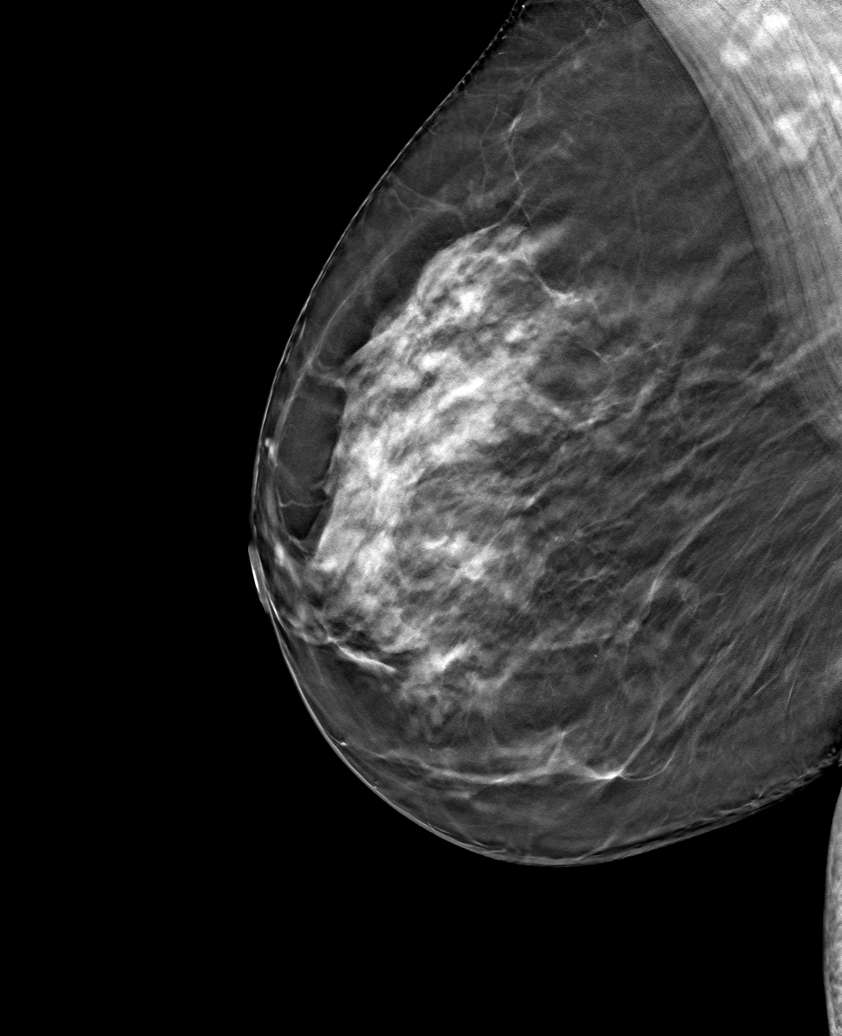

[L MLO tomo · tomo slice 41/81.0]
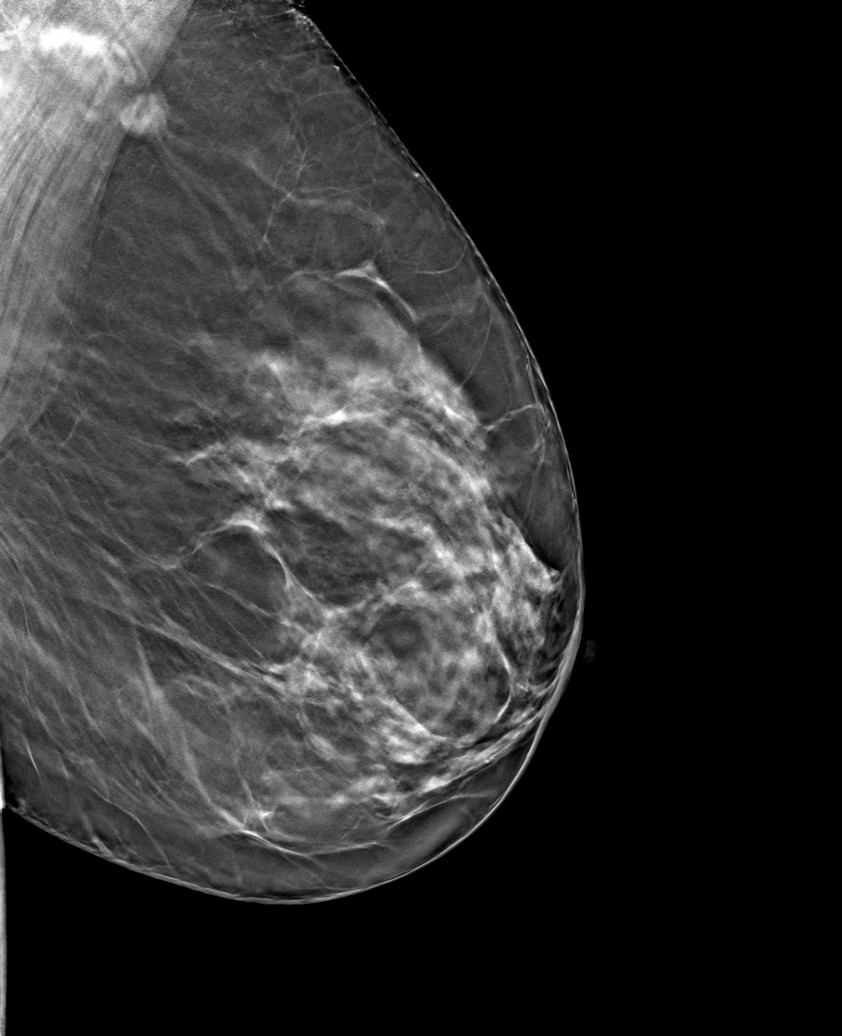

[R CC tomo · tomo slice 37/73.0]
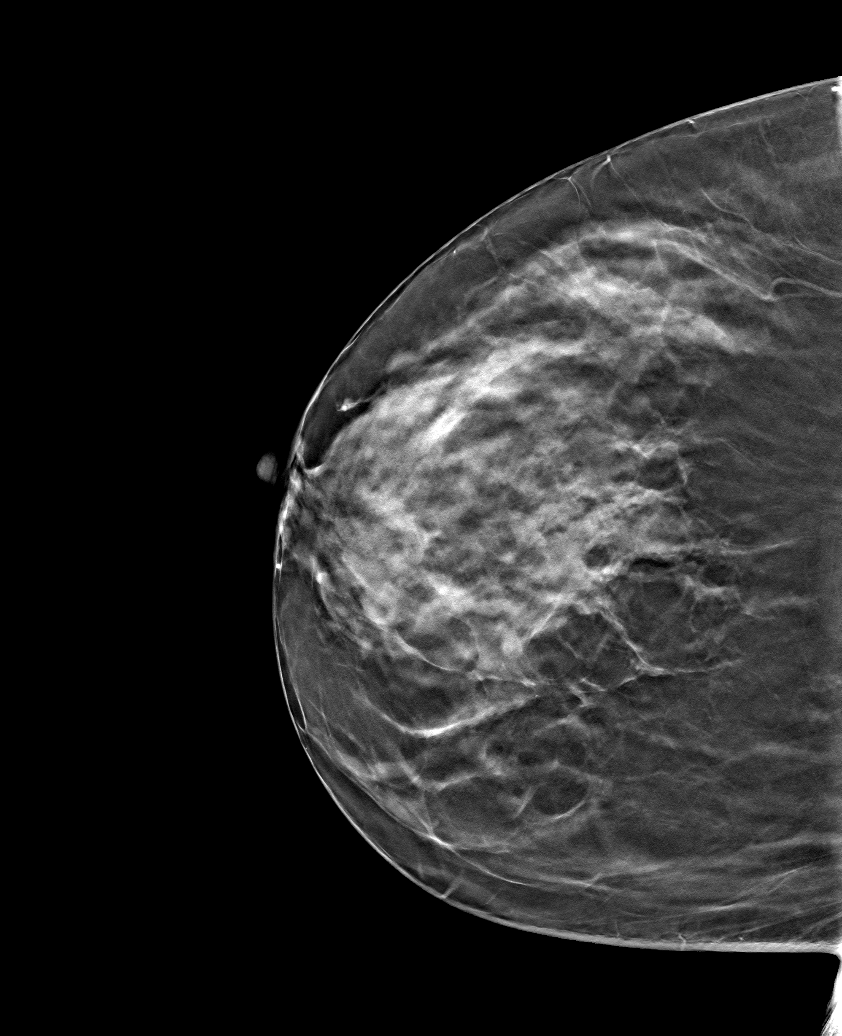

[6 of 22 positions shown; findings below may reference images not displayed]

ACR Breast Density Category c: The breast tissue is heterogeneously
dense, which may obscure small masses.
FINDINGS: There are no findings suspicious for malignancy. Images were
processed with CAD.
IMPRESSION: No mammographic evidence of malignancy. A result letter of this
screening mammogram will be mailed directly to the patient.

RECOMMENDATION:
Screening mammogram in one year. (Code:[id number])

BI-RADS CATEGORY  1: Negative.

## 2019-12-26 ENCOUNTER — Other Ambulatory Visit: Payer: Self-pay

## 2019-12-26 ENCOUNTER — Encounter: Payer: Self-pay | Admitting: Nurse Practitioner

## 2019-12-26 ENCOUNTER — Ambulatory Visit (INDEPENDENT_AMBULATORY_CARE_PROVIDER_SITE_OTHER): Payer: 59 | Admitting: Nurse Practitioner

## 2019-12-26 VITALS — BP 124/80 | Ht 65.0 in | Wt 172.0 lb

## 2019-12-26 DIAGNOSIS — Z716 Tobacco abuse counseling: Secondary | ICD-10-CM | POA: Diagnosis not present

## 2019-12-26 DIAGNOSIS — Z780 Asymptomatic menopausal state: Secondary | ICD-10-CM | POA: Diagnosis not present

## 2019-12-26 DIAGNOSIS — Z01419 Encounter for gynecological examination (general) (routine) without abnormal findings: Secondary | ICD-10-CM | POA: Diagnosis not present

## 2019-12-26 MED ORDER — VARENICLINE TARTRATE 1 MG PO TABS: 1.0000 mg | ORAL_TABLET | Freq: Two times a day (BID) | ORAL | 2 refills | Status: DC

## 2019-12-26 NOTE — Patient Instructions (Signed)
Health Maintenance, Female Adopting a healthy lifestyle and getting preventive care are important in promoting health and wellness. Ask your health care provider about:  The right schedule for you to have regular tests and exams.  Things you can do on your own to prevent diseases and keep yourself healthy. What should I know about diet, weight, and exercise? Eat a healthy diet   Eat a diet that includes plenty of vegetables, fruits, low-fat dairy products, and lean protein.  Do not eat a lot of foods that are high in solid fats, added sugars, or sodium. Maintain a healthy weight Body mass index (BMI) is used to identify weight problems. It estimates body fat based on height and weight. Your health care provider can help determine your BMI and help you achieve or maintain a healthy weight. Get regular exercise Get regular exercise. This is one of the most important things you can do for your health. Most adults should:  Exercise for at least 150 minutes each week. The exercise should increase your heart rate and make you sweat (moderate-intensity exercise).  Do strengthening exercises at least twice a week. This is in addition to the moderate-intensity exercise.  Spend less time sitting. Even light physical activity can be beneficial. Watch cholesterol and blood lipids Have your blood tested for lipids and cholesterol at 54 years of age, then have this test every 5 years. Have your cholesterol levels checked more often if:  Your lipid or cholesterol levels are high.  You are older than 54 years of age.  You are at high risk for heart disease. What should I know about cancer screening? Depending on your health history and family history, you may need to have cancer screening at various ages. This may include screening for:  Breast cancer.  Cervical cancer.  Colorectal cancer.  Skin cancer.  Lung cancer. What should I know about heart disease, diabetes, and high blood  pressure? Blood pressure and heart disease  High blood pressure causes heart disease and increases the risk of stroke. This is more likely to develop in people who have high blood pressure readings, are of African descent, or are overweight.  Have your blood pressure checked: ? Every 3-5 years if you are 18-39 years of age. ? Every year if you are 40 years old or older. Diabetes Have regular diabetes screenings. This checks your fasting blood sugar level. Have the screening done:  Once every three years after age 40 if you are at a normal weight and have a low risk for diabetes.  More often and at a younger age if you are overweight or have a high risk for diabetes. What should I know about preventing infection? Hepatitis B If you have a higher risk for hepatitis B, you should be screened for this virus. Talk with your health care provider to find out if you are at risk for hepatitis B infection. Hepatitis C Testing is recommended for:  Everyone born from 1945 through 1965.  Anyone with known risk factors for hepatitis C. Sexually transmitted infections (STIs)  Get screened for STIs, including gonorrhea and chlamydia, if: ? You are sexually active and are younger than 54 years of age. ? You are older than 54 years of age and your health care provider tells you that you are at risk for this type of infection. ? Your sexual activity has changed since you were last screened, and you are at increased risk for chlamydia or gonorrhea. Ask your health care provider if   you are at risk.  Ask your health care provider about whether you are at high risk for HIV. Your health care provider may recommend a prescription medicine to help prevent HIV infection. If you choose to take medicine to prevent HIV, you should first get tested for HIV. You should then be tested every 3 months for as long as you are taking the medicine. Pregnancy  If you are about to stop having your period (premenopausal) and  you may become pregnant, seek counseling before you get pregnant.  Take 400 to 800 micrograms (mcg) of folic acid every day if you become pregnant.  Ask for birth control (contraception) if you want to prevent pregnancy. Osteoporosis and menopause Osteoporosis is a disease in which the bones lose minerals and strength with aging. This can result in bone fractures. If you are 65 years old or older, or if you are at risk for osteoporosis and fractures, ask your health care provider if you should:  Be screened for bone loss.  Take a calcium or vitamin D supplement to lower your risk of fractures.  Be given hormone replacement therapy (HRT) to treat symptoms of menopause. Follow these instructions at home: Lifestyle  Do not use any products that contain nicotine or tobacco, such as cigarettes, e-cigarettes, and chewing tobacco. If you need help quitting, ask your health care provider.  Do not use street drugs.  Do not share needles.  Ask your health care provider for help if you need support or information about quitting drugs. Alcohol use  Do not drink alcohol if: ? Your health care provider tells you not to drink. ? You are pregnant, may be pregnant, or are planning to become pregnant.  If you drink alcohol: ? Limit how much you use to 0-1 drink a day. ? Limit intake if you are breastfeeding.  Be aware of how much alcohol is in your drink. In the [place] one drink equals one 12 oz bottle of beer (355 mL), one 5 oz glass of wine (148 mL), or one 1 oz glass of hard liquor (44 mL). General instructions  Schedule regular health, dental, and eye exams.  Stay current with your vaccines.  Tell your health care provider if: ? You often feel depressed. ? You have ever been abused or do not feel safe at home. Summary  Adopting a healthy lifestyle and getting preventive care are important in promoting health and wellness.  Follow your health care provider's instructions about healthy  diet, exercising, and getting tested or screened for diseases.  Follow your health care provider's instructions on monitoring your cholesterol and blood pressure. This information is not intended to replace advice given to you by your health care provider. Make sure you discuss any questions you have with your health care provider. Document Revised: 03/22/2018 Document Reviewed: 03/22/2018 Elsevier Patient Education  2020 Elsevier Inc.  

## 2019-12-26 NOTE — Progress Notes (Signed)
   Meredith Cox Aug 15, 1965 [id number]   History:  54 y.o. [id number] presents for annual exam without GYN complaints. 2005 LGSIL, subsequent paps normal. Postmenopausal - no HRT, one bleeding episode in April 2021. Hyperthyroidism managed by endocrinology, currently not on medication. Would like a new prescription for Chantix. She has done well with this in the past. She still has cravings.   Gynecologic History Patient's last menstrual period was 12/12/2017.   Contraception: tubal ligation Last Pap: 12/25/2018. Results were: normal Last mammogram: 06/05/2018. Results were: normal Last colonoscopy: 11/2018. 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  ROS:  A ROS was performed and pertinent positives and negatives are included.  Exam:  Vitals:   12/26/19 0817  BP: 124/80  Weight: 172 lb (78 kg)  Height: 5\' 5"  (1.651 m)   Body mass index is 28.62 kg/m.  General appearance:  Normal Thyroid:  Symmetrical, normal in size, without palpable masses or nodularity. Respiratory  Auscultation:  Clear without wheezing or rhonchi Cardiovascular  Auscultation:  Regular rate, without rubs, murmurs or gallops  Edema/varicosities:  Not grossly evident Abdominal  Soft,nontender, without masses, guarding or rebound.  Liver/spleen:  No organomegaly noted  Hernia:  None appreciated  Skin  Inspection:  Grossly normal   Breasts: Examined lying and sitting.   Right: Without masses, retractions, discharge or axillary adenopathy.   Left: Without masses, retractions, discharge or axillary adenopathy. Gentitourinary   Inguinal/mons:  Normal without inguinal adenopathy  External genitalia:  Normal  BUS/Urethra/Skene's glands:  Normal  Vagina:  Normal  Cervix:  Normal  Uterus:  Anteverted, normal in size, shape and contour.  Midline and  mobile  Adnexa/parametria:     Rt: Without masses or tenderness.   Lt: Without masses or tenderness.  Anus and perineum: Normal  Digital rectal exam: Normal sphincter tone without palpated masses or tenderness  Assessment/Plan:  54 y.o. 57 for annual exam.   Well female exam with routine gynecological exam - Education provided on SBEs, importance of preventative screenings, current guidelines, high calcium diet, regular exercise, and multivitamin daily. Labs done with work.   Encounter for smoking cessation counseling - Plan: varenicline (CHANTIX) 1 MG tablet twice a day for up to 12 weeks. Quit a few years ago but still has strong cravings at times.   Postmenopausal - since 12/2017. One episode of bleeding in April 2021 which was most likely an estrogen surge. No HRT, minimal hot flashes. She knows she return to office if she has another bleeding episode.  Follow up in 1 year for annual      May 2021 [facility], 8:27 AM 12/26/2019

## 2020-02-06 ENCOUNTER — Other Ambulatory Visit: Payer: Self-pay | Admitting: Nurse Practitioner

## 2020-02-06 ENCOUNTER — Ambulatory Visit
Admission: RE | Admit: 2020-02-06 | Discharge: 2020-02-06 | Disposition: A | Discharge status: Home / Self Care | Payer: 59 | Source: Ambulatory Visit | Attending: Nurse Practitioner | Admitting: Nurse Practitioner

## 2020-02-06 ENCOUNTER — Other Ambulatory Visit: Payer: Self-pay

## 2020-02-06 DIAGNOSIS — Z1231 Encounter for screening mammogram for malignant neoplasm of breast: Secondary | ICD-10-CM

## 2020-12-30 ENCOUNTER — Telehealth: Payer: Self-pay | Admitting: *Deleted

## 2020-12-30 ENCOUNTER — Encounter: Payer: Self-pay | Admitting: Nurse Practitioner

## 2020-12-30 ENCOUNTER — Ambulatory Visit (INDEPENDENT_AMBULATORY_CARE_PROVIDER_SITE_OTHER): Payer: 59 | Admitting: Nurse Practitioner

## 2020-12-30 ENCOUNTER — Other Ambulatory Visit: Payer: Self-pay

## 2020-12-30 VITALS — BP 122/78 | Ht 65.0 in | Wt 169.0 lb

## 2020-12-30 DIAGNOSIS — Z780 Asymptomatic menopausal state: Secondary | ICD-10-CM | POA: Diagnosis not present

## 2020-12-30 DIAGNOSIS — Z01419 Encounter for gynecological examination (general) (routine) without abnormal findings: Secondary | ICD-10-CM | POA: Diagnosis not present

## 2020-12-30 DIAGNOSIS — Z716 Tobacco abuse counseling: Secondary | ICD-10-CM

## 2020-12-30 MED ORDER — VARENICLINE TARTRATE 1 MG PO TABS: 1.0000 mg | ORAL_TABLET | Freq: Two times a day (BID) | ORAL | 2 refills | Status: AC

## 2020-12-30 NOTE — Telephone Encounter (Signed)
PA done via cover my meds for Chantix 1 mg tablet will wait for response from [facility].

## 2020-12-30 NOTE — Progress Notes (Signed)
Meredith Cox 21-Aug-1965 [id number]   History:  55 y.o. [id number] presents for annual exam without GYN complaints. 2005 LGSIL, subsequent paps normal. Postmenopausal - no HRT. Low TSH during covid infection a couple of years ago, saw endocrinology, this has resolved. She is still smoking 55 some and would like a prescription for Chantix. We sent this last year but she had an issue with insurance and was not able to get it.  Gynecologic History Patient's last menstrual period was 12/12/2017.   Contraception: tubal ligation Sexually active: Yes  Health Maintenance Last Pap: 12/25/2018. Results were: Normal, 5-year repeat Last mammogram: 02/06/2020. Results were: Normal Last colonoscopy: 11/2018. 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 Works for [employer]. 2 daughters, 6 grandchildren. Maternal aunt with history of breast and cervical cancer.   ROS:  A ROS was performed and pertinent positives and negatives are included.  Exam:  Vitals:   12/30/20 0807  BP: 122/78  Weight: 169 lb (76.7 kg)  Height: 5\' 5"  (1.651 m)    Body mass index is 28.12 kg/m.  General appearance:  Normal Thyroid:  Symmetrical, normal in size, without palpable masses or nodularity. Respiratory  Auscultation:  Clear without wheezing or rhonchi Cardiovascular  Auscultation:  Regular rate, without rubs, murmurs or gallops  Edema/varicosities:  Not grossly evident Abdominal  Soft,nontender, without masses, guarding or rebound.  Liver/spleen:  No organomegaly noted  Hernia:  None appreciated  Skin  Inspection:  Grossly normal   Breasts: Examined lying and sitting.   Right: Without masses, retractions, discharge or axillary adenopathy.   Left: Without masses, retractions, discharge or axillary adenopathy. Gentitourinary    Inguinal/mons:  Normal without inguinal adenopathy  External genitalia:  Normal  BUS/Urethra/Skene's glands:  Normal  Vagina:  Normal  Cervix:  Normal  Uterus:  Normal in size, shape and contour.  Midline and mobile  Adnexa/parametria:     Rt: Without masses or tenderness.   Lt: Without masses or tenderness.  Anus and perineum: Normal  Digital rectal exam: Normal sphincter tone without palpated masses or tenderness  Patient informed chaperone available to be present for breast and pelvic exam. Patient has requested no chaperone to be present. Patient has been advised what will be completed during breast and pelvic exam.   Assessment/Plan:  55 y.o. 53 for annual exam.   Well female exam with routine gynecological exam - Education provided on SBEs, importance of preventative screenings, current guidelines, high calcium diet, regular exercise, and multivitamin daily. Labs with PCP and/or work.   Postmenopausal - no HRT, no bleeding.   Encounter for smoking cessation counseling - Plan: varenicline (CHANTIX) 1 MG tablet. She is still smoking 55 some and would like a prescription for Chantix. We sent this last year but she had an issue with insurance and was not able to get it. She has used before and did well. She is aware of proper use.   Screening for cervical cancer - 2005 LGSIL, subsequent paps normal.  Will repeat at 5-year interval per guidelines.  Screening for breast cancer - Normal mammogram history.  Continue annual screenings.  Normal breast exam today.  Screening for colon cancer - 2020 colonoscopy. Will repeat at GI's recommended interval.   Screening for osteoporosis - Average risk. Will plan for Dexa at age 68.   Follow up in 1 year for  annual      Olivia Mackie [facility], 8:30 AM 12/30/2020

## 2020-12-30 NOTE — Telephone Encounter (Signed)
[facility] denied generic Chantix 1 mg tablet, patient will need to try/fail or cannot use bupropion (generic Zyban). Is this an option for patient? Please advise

## 2020-12-31 ENCOUNTER — Other Ambulatory Visit: Payer: Self-pay | Admitting: Nurse Practitioner

## 2020-12-31 DIAGNOSIS — Z716 Tobacco abuse counseling: Secondary | ICD-10-CM

## 2020-12-31 DIAGNOSIS — F17210 Nicotine dependence, cigarettes, uncomplicated: Secondary | ICD-10-CM

## 2020-12-31 MED ORDER — BUPROPION HCL ER (SR) 150 MG PO TB12: 150.0000 mg | ORAL_TABLET | Freq: Two times a day (BID) | ORAL | 2 refills | Status: DC

## 2020-12-31 NOTE — Telephone Encounter (Signed)
Patient informed with all the below.  

## 2020-12-31 NOTE — Telephone Encounter (Signed)
I will send to her pharmacy. She will take 150 mg daily x 3 days and then increase to 150 mg twice daily. Trouble sleeping can be a side effect, so I recommend taking second dose 8 hours after first dose each day to help with this. Can take with or without food. Possible side effects can include headache, constipation, nausea, dry mouth, drowsiness, or trouble sleeping. These typically subside after a couple of weeks.

## 2020-12-31 NOTE — Telephone Encounter (Signed)
Patient informed, she is willing to try Rx.

## 2020-12-31 NOTE — Telephone Encounter (Signed)
Wellbutrin is an option for her if she is OK with taking. Let her know it would be a 12 week course. Usually cravings will start to decrease after 1-2 weeks of use.

## 2021-01-25 ENCOUNTER — Other Ambulatory Visit: Payer: Self-pay | Admitting: Nurse Practitioner

## 2021-01-25 DIAGNOSIS — F17210 Nicotine dependence, cigarettes, uncomplicated: Secondary | ICD-10-CM

## 2021-03-06 ENCOUNTER — Other Ambulatory Visit: Payer: Self-pay | Admitting: Nurse Practitioner

## 2021-03-06 DIAGNOSIS — F17210 Nicotine dependence, cigarettes, uncomplicated: Secondary | ICD-10-CM

## 2021-03-09 ENCOUNTER — Other Ambulatory Visit: Payer: Self-pay | Admitting: Nurse Practitioner

## 2021-03-09 DIAGNOSIS — Z1231 Encounter for screening mammogram for malignant neoplasm of breast: Secondary | ICD-10-CM

## 2021-03-09 NOTE — Telephone Encounter (Signed)
Pharmacy requesting 90 day supply

## 2021-04-10 ENCOUNTER — Ambulatory Visit
Admission: RE | Admit: 2021-04-10 | Discharge: 2021-04-10 | Disposition: A | Discharge status: Home / Self Care | Payer: 59 | Source: Ambulatory Visit | Attending: Nurse Practitioner | Admitting: Nurse Practitioner

## 2021-04-10 DIAGNOSIS — Z1231 Encounter for screening mammogram for malignant neoplasm of breast: Secondary | ICD-10-CM

## 2021-09-24 ENCOUNTER — Other Ambulatory Visit: Payer: Self-pay | Admitting: Nurse Practitioner

## 2021-09-24 DIAGNOSIS — F17210 Nicotine dependence, cigarettes, uncomplicated: Secondary | ICD-10-CM

## 2021-09-24 NOTE — Telephone Encounter (Signed)
Last AEX 12/30/20--scheduled for 12/31/21.

## 2021-12-28 ENCOUNTER — Other Ambulatory Visit: Payer: Self-pay | Admitting: Nurse Practitioner

## 2021-12-28 DIAGNOSIS — F17210 Nicotine dependence, cigarettes, uncomplicated: Secondary | ICD-10-CM

## 2021-12-29 NOTE — Telephone Encounter (Signed)
AEX 12/30/2020. Scheduled AEX 12/31/2021.

## 2021-12-30 NOTE — Telephone Encounter (Signed)
She was originally prescribed this to help her stop smoking, not for long term use. I plan to discuss with her tomorrow at her visit.

## 2021-12-31 ENCOUNTER — Ambulatory Visit (INDEPENDENT_AMBULATORY_CARE_PROVIDER_SITE_OTHER): Payer: 59 | Admitting: Nurse Practitioner

## 2021-12-31 ENCOUNTER — Encounter: Payer: Self-pay | Admitting: Nurse Practitioner

## 2021-12-31 VITALS — BP 110/78 | HR 89 | Ht 64.5 in | Wt 176.0 lb

## 2021-12-31 DIAGNOSIS — Z780 Asymptomatic menopausal state: Secondary | ICD-10-CM | POA: Diagnosis not present

## 2021-12-31 DIAGNOSIS — Z01419 Encounter for gynecological examination (general) (routine) without abnormal findings: Secondary | ICD-10-CM

## 2021-12-31 DIAGNOSIS — Z13820 Encounter for screening for osteoporosis: Secondary | ICD-10-CM

## 2021-12-31 DIAGNOSIS — F17210 Nicotine dependence, cigarettes, uncomplicated: Secondary | ICD-10-CM | POA: Diagnosis not present

## 2021-12-31 DIAGNOSIS — Z7689 Persons encountering health services in other specified circumstances: Secondary | ICD-10-CM

## 2021-12-31 NOTE — Progress Notes (Signed)
Meredith Cox 03/09/1966 [id number]   History:  56 y.o. [id number] presents for annual exam without GYN complaints. 2005 LGSIL, subsequent paps normal. Postmenopausal - no HRT. Stopped smoking last year but then lost a couple of family members and picked it back up. She is smoking 6 per day. Has one refill of Wellbutrin and plans to do this again. Wants to discuss weight management but does not want anything that will increase her blood pressure.   Gynecologic History Patient's last menstrual period was 12/12/2017.   Contraception: tubal ligation Sexually active: Yes  Health Maintenance Last Pap: 12/25/2018. Results were: Normal neg HR HPV Last mammogram: 04/10/2021. Results were: Normal Last colonoscopy: 11/2018. Results were: Adenoma, 5-year recall Last Dexa: Not indicated  Past medical history, past surgical history, family history and social history were all reviewed and documented in the EPIC chart. Boyfriend. Works for [employer]. 2 daughters, 6 grandchildren, 2 great grandchildren. Maternal aunt with history of breast and cervical cancer.   ROS:  A ROS was performed and pertinent positives and negatives are included.  Exam:  Vitals:   12/31/21 0801  BP: 110/78  Pulse: 89  SpO2: 98%  Weight: 176 lb (79.8 kg)  Height: 5' 4.5" (1.638 m)     Body mass index is 29.74 kg/m.  General appearance:  Normal Thyroid:  Symmetrical, normal in size, without palpable masses or nodularity. Respiratory  Auscultation:  Clear without wheezing or rhonchi Cardiovascular  Auscultation:  Regular rate, without rubs, murmurs or gallops  Edema/varicosities:  Not grossly evident Abdominal  Soft,nontender, without masses, guarding or rebound.  Liver/spleen:  No organomegaly noted  Hernia:  None appreciated  Skin  Inspection:  Grossly normal   Breasts: Examined lying and sitting.   Right: Without masses, retractions, discharge or axillary adenopathy.   Left: Without masses,  retractions, discharge or axillary adenopathy. Genitourinary   Inguinal/mons:  Normal without inguinal adenopathy  External genitalia:  Normal appearing vulva with no masses, tenderness, or lesions  BUS/Urethra/Skene's glands:  Normal  Vagina:  Normal appearing with normal color and discharge, no lesions  Cervix:  Normal appearing without discharge or lesions  Uterus:  Normal in size, shape and contour.  Midline and mobile, nontender  Adnexa/parametria:     Rt: Normal in size, without masses or tenderness.   Lt: Normal in size, without masses or tenderness.  Anus and perineum: Normal  Digital rectal exam: Normal sphincter tone without palpated masses or tenderness  Patient informed chaperone available to be present for breast and pelvic exam. Patient has requested no chaperone to be present. Patient has been advised what will be completed during breast and pelvic exam.   Assessment/Plan:  56 y.o. [id number] for annual exam.   Well female exam with routine gynecological exam - Education provided on SBEs, importance of preventative screenings, current guidelines, high calcium diet, regular exercise, and multivitamin daily. Labs with work today. Has PCP.   Postmenopausal - Plan: DG Bone Density. no HRT, no bleeding.  Screening for osteoporosis - Plan: DG Bone Density. Long-term smoker, postmenopausal.   Cigarette smoker - Plan: DG Bone Density. Stopped smoking last year but then lost a couple of family members and picked it back up. She is smoking 6 per day. Has one refill of Wellbutrin and plans to do this again as this worked last year.   Encounter for weight management - BMI 29.74. Discussed importance of calorie deficit for weight loss, intermittent fasting, increase exercise. Recommended seeing nutritionist.   Screening  for cervical cancer - 2005 LGSIL, subsequent paps normal.  Will repeat at 5-year interval per guidelines.  Screening for breast cancer - Normal mammogram history.   Continue annual screenings.  Normal breast exam today.  Screening for colon cancer - 2020 colonoscopy. Will repeat at GI's recommended interval.   Follow up in 1 year for annual.     Olivia Mackie [facility], 8:34 AM 12/31/2021

## 2022-03-24 ENCOUNTER — Other Ambulatory Visit: Payer: Self-pay | Admitting: Nurse Practitioner

## 2022-03-24 DIAGNOSIS — Z1231 Encounter for screening mammogram for malignant neoplasm of breast: Secondary | ICD-10-CM

## 2022-05-03 ENCOUNTER — Ambulatory Visit
Admission: RE | Admit: 2022-05-03 | Discharge: 2022-05-03 | Disposition: A | Discharge status: Home / Self Care | Payer: 59 | Source: Ambulatory Visit | Attending: Nurse Practitioner | Admitting: Nurse Practitioner

## 2022-05-03 DIAGNOSIS — Z1231 Encounter for screening mammogram for malignant neoplasm of breast: Secondary | ICD-10-CM

## 2022-05-05 ENCOUNTER — Other Ambulatory Visit: Payer: Self-pay | Admitting: Nurse Practitioner

## 2022-05-05 DIAGNOSIS — R928 Other abnormal and inconclusive findings on diagnostic imaging of breast: Secondary | ICD-10-CM

## 2022-05-10 ENCOUNTER — Ambulatory Visit
Admission: RE | Admit: 2022-05-10 | Discharge: 2022-05-10 | Disposition: A | Discharge status: Home / Self Care | Payer: 59 | Source: Ambulatory Visit | Attending: Nurse Practitioner | Admitting: Nurse Practitioner

## 2022-05-10 DIAGNOSIS — R928 Other abnormal and inconclusive findings on diagnostic imaging of breast: Secondary | ICD-10-CM

## 2022-05-11 ENCOUNTER — Other Ambulatory Visit: Payer: Self-pay | Admitting: Nurse Practitioner

## 2022-05-11 DIAGNOSIS — R920 Mammographic microcalcification found on diagnostic imaging of breast: Secondary | ICD-10-CM

## 2022-05-11 DIAGNOSIS — N6489 Other specified disorders of breast: Secondary | ICD-10-CM

## 2022-05-13 ENCOUNTER — Ambulatory Visit
Admission: RE | Admit: 2022-05-13 | Discharge: 2022-05-13 | Disposition: A | Discharge status: Home / Self Care | Payer: 59 | Source: Ambulatory Visit | Attending: Nurse Practitioner | Admitting: Nurse Practitioner

## 2022-05-13 DIAGNOSIS — R920 Mammographic microcalcification found on diagnostic imaging of breast: Secondary | ICD-10-CM

## 2022-05-13 DIAGNOSIS — N6489 Other specified disorders of breast: Secondary | ICD-10-CM

## 2022-05-13 HISTORY — PX: BREAST BIOPSY: SHX20

## 2023-01-03 ENCOUNTER — Ambulatory Visit: Payer: 59 | Admitting: Nurse Practitioner

## 2023-01-20 ENCOUNTER — Other Ambulatory Visit: Payer: Self-pay | Admitting: Nurse Practitioner

## 2023-01-20 DIAGNOSIS — Z0000 Encounter for general adult medical examination without abnormal findings: Secondary | ICD-10-CM

## 2023-03-30 ENCOUNTER — Encounter: Payer: Self-pay | Admitting: Nurse Practitioner

## 2023-03-30 ENCOUNTER — Ambulatory Visit (INDEPENDENT_AMBULATORY_CARE_PROVIDER_SITE_OTHER): Payer: 59 | Admitting: Nurse Practitioner

## 2023-03-30 VITALS — BP 134/82 | HR 79 | Ht 64.57 in | Wt 177.2 lb

## 2023-03-30 DIAGNOSIS — F17200 Nicotine dependence, unspecified, uncomplicated: Secondary | ICD-10-CM

## 2023-03-30 DIAGNOSIS — Z01419 Encounter for gynecological examination (general) (routine) without abnormal findings: Secondary | ICD-10-CM

## 2023-03-30 DIAGNOSIS — Z780 Asymptomatic menopausal state: Secondary | ICD-10-CM | POA: Diagnosis not present

## 2023-03-30 NOTE — Progress Notes (Signed)
   Meredith Cox 11-Nov-1965 [id number]   History:  57 y.o. [id number] presents for annual exam without GYN complaints. 2005 LGSIL, subsequent paps normal. Postmenopausal - no HRT. Smoker.   Gynecologic History Patient's last menstrual period was 12/12/2017.   Contraception: post menopausal status and tubal ligation Sexually active: Yes  Health Maintenance Last Pap: 12/25/2018. Results were: Normal neg HPV Last mammogram: 05/03/2022 Results were: Possible left breast distortion, benign biopsy Last colonoscopy: 11/2018. Results were: Adenoma, 5-year recall Last Dexa: Not indicated  Past medical history, past surgical history, family history and social history were all reviewed and documented in the EPIC chart. Boyfriend. Works for [employer]. 2 daughters, 6 grandchildren, 2 great grandchildren. Maternal aunt with history of breast and cervical cancer.   ROS:  A ROS was performed and pertinent positives and negatives are included.  Exam:  Vitals:   03/30/23 1117  BP: 134/82  Pulse: 79  SpO2: 100%  Weight: 177 lb 3.2 oz (80.4 kg)  Height: 5' 4.57" (1.64 m)      Body mass index is 29.88 kg/m.  General appearance:  Normal Thyroid:  Symmetrical, normal in size, without palpable masses or nodularity. Respiratory  Auscultation:  Clear without wheezing or rhonchi Cardiovascular  Auscultation:  Regular rate, without rubs, murmurs or gallops  Edema/varicosities:  Not grossly evident Abdominal  Soft,nontender, without masses, guarding or rebound.  Liver/spleen:  No organomegaly noted  Hernia:  None appreciated  Skin  Inspection:  Grossly normal   Breasts: Examined lying and sitting.   Right: Without masses, retractions, discharge or axillary adenopathy.   Left: Without masses, retractions, discharge or axillary adenopathy. Pelvic: External genitalia:  no lesions              Urethra:  normal appearing urethra with no masses, tenderness or lesions              Bartholins  and Skenes: normal                 Vagina: normal appearing vagina with normal color and discharge, no lesions              Cervix: no lesions Bimanual Exam:  Uterus:  no masses or tenderness              Adnexa: no mass, fullness, tenderness              Rectovaginal: Deferred              Anus:  normal, no lesions  Patient informed chaperone available to be present for breast and pelvic exam. Patient has requested no chaperone to be present. Patient has been advised what will be completed during breast and pelvic exam.   Assessment/Plan:  57 y.o. Meredith Cox for annual exam.   Well female exam with routine gynecological exam - Education provided on SBEs, importance of preventative screenings, current guidelines, high calcium diet, regular exercise, and multivitamin daily. Labs through work. Has PCP.   Postmenopausal - Plan: DG Bone Density. no HRT, no bleeding.  Smoker - Plan: DG Bone Density  Screening for cervical cancer - 2005 LGSIL, subsequent paps normal.  Will repeat at 5-year interval per guidelines.  Screening for breast cancer - 05/2022 benign breast biopsy. Mammogram scheduled in January.    Screening for colon cancer - 2020 colonoscopy. Will repeat at GI's recommended interval.   Return in about 1 year (around 03/29/2024) for Annual.     Olivia Mackie [facility], 11:35 AM 03/30/2023

## 2023-04-04 ENCOUNTER — Other Ambulatory Visit: Payer: Self-pay | Admitting: Nurse Practitioner

## 2023-04-04 DIAGNOSIS — F17200 Nicotine dependence, unspecified, uncomplicated: Secondary | ICD-10-CM

## 2023-04-04 DIAGNOSIS — Z780 Asymptomatic menopausal state: Secondary | ICD-10-CM

## 2023-05-05 ENCOUNTER — Ambulatory Visit
Admission: RE | Admit: 2023-05-05 | Discharge: 2023-05-05 | Disposition: A | Discharge status: Home / Self Care | Payer: 59 | Source: Ambulatory Visit | Attending: Nurse Practitioner | Admitting: Nurse Practitioner

## 2023-05-05 DIAGNOSIS — Z0000 Encounter for general adult medical examination without abnormal findings: Secondary | ICD-10-CM

## 2023-11-29 ENCOUNTER — Ambulatory Visit (HOSPITAL_COMMUNITY)
Admission: RE | Admit: 2023-11-29 | Discharge: 2023-11-29 | Disposition: A | Discharge status: Home / Self Care | Source: Ambulatory Visit | Attending: Nurse Practitioner | Admitting: Nurse Practitioner

## 2023-11-29 ENCOUNTER — Ambulatory Visit: Payer: Self-pay | Admitting: Nurse Practitioner

## 2023-11-29 ENCOUNTER — Other Ambulatory Visit: Payer: 59

## 2023-11-29 DIAGNOSIS — F17200 Nicotine dependence, unspecified, uncomplicated: Secondary | ICD-10-CM | POA: Diagnosis present

## 2023-11-29 DIAGNOSIS — Z780 Asymptomatic menopausal state: Secondary | ICD-10-CM | POA: Diagnosis present

## 2024-04-10 NOTE — Progress Notes (Unsigned)
 "  Meredith Cox December 28, 1965 [id number]   History:  58 y.o. [id number] presents for annual exam. Had spotting about 4 months ago that lasted about a day, mostly with wiping. Had not been sexually active prior. Was on antibiotics recently and has noticed vaginal odor since. 2005 LGSIL, subsequent paps normal. Postmenopausal - no HRT. Smoker.   Gynecologic History Patient's last menstrual period was 12/12/2017.   Contraception: post menopausal status and tubal ligation Sexually active: Yes  Health Maintenance Last Pap: 12/25/2018. Results were: Normal neg HPV Last mammogram: 05/05/2023. Results were: Normal Last colonoscopy: 11/2018. Results were: Adenoma Last Dexa: 11/29/2023. Results were: Normal     04/11/2024    7:35 AM  Depression screen PHQ 2/9  Decreased Interest 0  Down, Depressed, Hopeless 0  PHQ - 2 Score 0     Past medical history, past surgical history, family history and social history were all reviewed and documented in the EPIC chart. Boyfriend. Works for [employer]. 2 daughters, 6 grandchildren, 2 great grandchildren. Maternal aunt with history of breast and cervical cancer.   ROS:  A ROS was performed and pertinent positives and negatives are included.  Exam:  Vitals:   04/11/24 0730  BP: 124/82  Pulse: 80  SpO2: 100%  Weight: 185 lb (83.9 kg)  Height: 5' 4.75 (1.645 m)       Body mass index is 31.02 kg/m.  General appearance:  Normal Thyroid :  Symmetrical, normal in size, without palpable masses or nodularity. Respiratory  Auscultation:  Clear without wheezing or rhonchi Cardiovascular  Auscultation:  Regular rate, without rubs, murmurs or gallops  Edema/varicosities:  Not grossly evident Abdominal  Soft,nontender, without masses, guarding or rebound.  Liver/spleen:  No organomegaly noted  Hernia:  None appreciated  Skin  Inspection:  Grossly normal   Breasts: Examined lying and sitting.   Right: Without masses, retractions, discharge or  axillary adenopathy.   Left: Without masses, retractions, discharge or axillary adenopathy. Pelvic: External genitalia:  no lesions              Urethra:  normal appearing urethra with no masses, tenderness or lesions              Bartholins and Skenes: normal                 Vagina: + discharge, mild erythema              Cervix: no lesions Bimanual Exam:  Uterus:  no masses or tenderness              Adnexa: no mass, fullness, tenderness              Rectovaginal: Deferred              Anus:  normal, no lesions  Kari Leaven, CMA present as chaperone.   Wet prep + trich, + clue cells  Assessment/Plan:  58 y.o. [id number] for annual exam.   Well female exam with routine gynecological exam - Education provided on SBEs, importance of preventative screenings, current guidelines, high calcium diet, regular exercise, and multivitamin daily. Labs through work. Has PCP.   Postmenopausal - No HRT  Depression screening - PHQ - 0  Cervical cancer screening - Plan: Cytology - PAP( [place]). 2005 LGSIL, subsequent paps normal. Pap today per guidelines.   Vaginal odor - Plan: WET PREP FOR TRICH, YEAST, CLUE. + trich, + clue cells.  PMB (postmenopausal bleeding) - Plan: US  PELVIS TRANSVAGINAL NON-OB (TV  ONLY)  Trichomonas infection - Plan: metroNIDAZOLE  (FLAGYL ) 500 MG tablet BID x 7 days. Inform partner so they can be treated, no intercourse for 7 days after completing treatment. TOC in 4 weeks.   Bacterial vaginosis - Plan: metroNIDAZOLE  (FLAGYL ) 500 MG tablet BID x 7 days.   PMB (postmenopausal bleeding) - Plan: US  PELVIS TRANSVAGINAL NON-OB (TV ONLY)  Screening for breast cancer - 05/2022 benign breast biopsy. Normal 04/2022. Continue annual screenings. Normal breast exam today.   Screening for colon cancer - 2020 colonoscopy. Will repeat at GI's recommended interval.   Screening for osteoporosis - Normal bone density 11/2023. Repeat DXA at age 40.   Return in about 4 weeks (around  05/09/2024) for TOC, 1 year for annual.  I personally spent a total of 30 minutes in the care of the patient today including preparing to see the patient, getting/reviewing separately obtained history, performing a medically appropriate exam/evaluation, counseling and educating, placing orders, documenting clinical information in the EHR, independently interpreting results, and communicating results.    Meredith Cox [facility], 9:03 AM 04/11/2024 "

## 2024-04-11 ENCOUNTER — Encounter: Payer: Self-pay | Admitting: Nurse Practitioner

## 2024-04-11 ENCOUNTER — Ambulatory Visit (INDEPENDENT_AMBULATORY_CARE_PROVIDER_SITE_OTHER): Admitting: Nurse Practitioner

## 2024-04-11 ENCOUNTER — Other Ambulatory Visit (HOSPITAL_COMMUNITY)
Admission: RE | Admit: 2024-04-11 | Discharge: 2024-04-11 | Disposition: A | Source: Ambulatory Visit | Attending: Nurse Practitioner | Admitting: Nurse Practitioner

## 2024-04-11 VITALS — BP 124/82 | HR 80 | Ht 64.75 in | Wt 185.0 lb

## 2024-04-11 DIAGNOSIS — Z01419 Encounter for gynecological examination (general) (routine) without abnormal findings: Secondary | ICD-10-CM | POA: Diagnosis present

## 2024-04-11 DIAGNOSIS — Z124 Encounter for screening for malignant neoplasm of cervix: Secondary | ICD-10-CM | POA: Diagnosis present

## 2024-04-11 DIAGNOSIS — Z780 Asymptomatic menopausal state: Secondary | ICD-10-CM

## 2024-04-11 DIAGNOSIS — N898 Other specified noninflammatory disorders of vagina: Secondary | ICD-10-CM

## 2024-04-11 DIAGNOSIS — A599 Trichomoniasis, unspecified: Secondary | ICD-10-CM | POA: Diagnosis not present

## 2024-04-11 DIAGNOSIS — N760 Acute vaginitis: Secondary | ICD-10-CM | POA: Diagnosis not present

## 2024-04-11 DIAGNOSIS — Z1331 Encounter for screening for depression: Secondary | ICD-10-CM | POA: Diagnosis not present

## 2024-04-11 DIAGNOSIS — B9689 Other specified bacterial agents as the cause of diseases classified elsewhere: Secondary | ICD-10-CM | POA: Diagnosis not present

## 2024-04-11 DIAGNOSIS — N950 Postmenopausal bleeding: Secondary | ICD-10-CM

## 2024-04-11 LAB — WET PREP FOR TRICH, YEAST, CLUE

## 2024-04-11 MED ORDER — METRONIDAZOLE 500 MG PO TABS: 500.0000 mg | ORAL_TABLET | Freq: Two times a day (BID) | ORAL | 0 refills | Status: AC

## 2024-04-16 LAB — CYTOLOGY - PAP
Comment: NEGATIVE
Diagnosis: REACTIVE
High risk HPV: NEGATIVE

## 2024-04-17 ENCOUNTER — Ambulatory Visit: Payer: Self-pay | Admitting: Nurse Practitioner

## 2024-04-23 NOTE — Progress Notes (Unsigned)
" ° °  Acute Office Visit  Subjective:    Patient ID: Meredith Cox, female    DOB: 30-Jun-1965, 59 y.o.   MRN: [id number]   HPI 59 y.o. presents today for ultrasound. Seen 04/18/23 for annual exam and reported potting about 4 months ago that lasted about a day, mostly with wiping. Had not been sexually active prior. Treated for BV at that visit.   Patient's last menstrual period was 12/12/2017.    Review of Systems     Objective:    Physical Exam  LMP 12/12/2017  Wt Readings from Last 3 Encounters:  04/11/24 185 lb (83.9 kg)  03/30/23 177 lb 3.2 oz (80.4 kg)  12/31/21 176 lb (79.8 kg)        Zada Louder, CMA present as biomedical engineer.   Assessment & Plan:   Problem List Items Addressed This Visit   None   No follow-ups on file.    Annabella DELENA Shutter DNP, 12:24 PM 04/23/2024 "

## 2024-04-24 ENCOUNTER — Other Ambulatory Visit: Admitting: Nurse Practitioner

## 2024-04-24 ENCOUNTER — Other Ambulatory Visit

## 2024-04-24 DIAGNOSIS — N950 Postmenopausal bleeding: Secondary | ICD-10-CM

## 2024-05-09 ENCOUNTER — Ambulatory Visit: Admitting: Nurse Practitioner

## 2025-04-15 ENCOUNTER — Ambulatory Visit: Admitting: Nurse Practitioner
